# Patient Record
Sex: Female | Born: 1945 | Race: White | Hispanic: No
Health system: Southern US, Community
[De-identification: ages and names within clinical notes are randomized; demographics above are authoritative.]

## PROBLEM LIST (undated history)

## (undated) DIAGNOSIS — E785 Hyperlipidemia, unspecified: Secondary | ICD-10-CM

## (undated) DIAGNOSIS — E079 Disorder of thyroid, unspecified: Secondary | ICD-10-CM

## (undated) DIAGNOSIS — F0280 Dementia in other diseases classified elsewhere without behavioral disturbance: Secondary | ICD-10-CM

## (undated) DIAGNOSIS — F329 Major depressive disorder, single episode, unspecified: Secondary | ICD-10-CM

## (undated) DIAGNOSIS — F419 Anxiety disorder, unspecified: Secondary | ICD-10-CM

## (undated) DIAGNOSIS — I10 Essential (primary) hypertension: Secondary | ICD-10-CM

## (undated) DIAGNOSIS — G309 Alzheimer's disease, unspecified: Secondary | ICD-10-CM

## (undated) DIAGNOSIS — F32A Depression, unspecified: Secondary | ICD-10-CM

---

## 2007-08-13 ENCOUNTER — Encounter: Payer: Self-pay | Admitting: Internal Medicine

## 2009-11-21 ENCOUNTER — Encounter (INDEPENDENT_AMBULATORY_CARE_PROVIDER_SITE_OTHER): Payer: Self-pay | Admitting: *Deleted

## 2010-01-09 ENCOUNTER — Ambulatory Visit: Payer: Self-pay | Admitting: Internal Medicine

## 2010-01-09 DIAGNOSIS — I10 Essential (primary) hypertension: Secondary | ICD-10-CM | POA: Insufficient documentation

## 2010-01-09 DIAGNOSIS — E079 Disorder of thyroid, unspecified: Secondary | ICD-10-CM | POA: Insufficient documentation

## 2010-01-09 DIAGNOSIS — K649 Unspecified hemorrhoids: Secondary | ICD-10-CM | POA: Insufficient documentation

## 2010-01-09 DIAGNOSIS — E785 Hyperlipidemia, unspecified: Secondary | ICD-10-CM | POA: Insufficient documentation

## 2010-01-09 DIAGNOSIS — K5900 Constipation, unspecified: Secondary | ICD-10-CM | POA: Insufficient documentation

## 2010-01-09 DIAGNOSIS — F329 Major depressive disorder, single episode, unspecified: Secondary | ICD-10-CM

## 2010-01-09 DIAGNOSIS — G309 Alzheimer's disease, unspecified: Secondary | ICD-10-CM

## 2010-01-09 DIAGNOSIS — M129 Arthropathy, unspecified: Secondary | ICD-10-CM | POA: Insufficient documentation

## 2010-01-09 DIAGNOSIS — F3289 Other specified depressive episodes: Secondary | ICD-10-CM | POA: Insufficient documentation

## 2010-01-09 DIAGNOSIS — K625 Hemorrhage of anus and rectum: Secondary | ICD-10-CM

## 2010-01-09 DIAGNOSIS — F0280 Dementia in other diseases classified elsewhere without behavioral disturbance: Secondary | ICD-10-CM

## 2010-01-09 DIAGNOSIS — E669 Obesity, unspecified: Secondary | ICD-10-CM

## 2010-01-09 DIAGNOSIS — R1084 Generalized abdominal pain: Secondary | ICD-10-CM

## 2010-01-22 ENCOUNTER — Ambulatory Visit: Admit: 2010-01-22 | Payer: Self-pay | Admitting: Internal Medicine

## 2010-02-08 ENCOUNTER — Encounter (INDEPENDENT_AMBULATORY_CARE_PROVIDER_SITE_OTHER): Payer: Self-pay | Admitting: *Deleted

## 2010-02-09 ENCOUNTER — Ambulatory Visit
Admission: RE | Admit: 2010-02-09 | Discharge: 2010-02-09 | Payer: Self-pay | Source: Home / Self Care | Attending: Internal Medicine | Admitting: Internal Medicine

## 2010-02-13 NOTE — Letter (Signed)
Summary: New Patient letter  [facility]  [address] [place], [place] [address]   Phone: [phone]  Fax: [phone]       11/21/2009 MRN: [id number]  [facility] [address] [place], [place]  [address]  Dear Ms. Slavens,  Welcome to the Gastroenterology Division at [facility].    You are scheduled to see Dr.  Marina Goodell on Tuesday, 01/09/10 at 3:30 p.m.  on the 3rd floor at [facility], [address]  We ask that you try to arrive at our office 15 minutes prior to your appointment time to allow for check-in.  We would like you to complete the enclosed self-administered evaluation form prior to your visit and bring it with you on the day of your appointment.  We will review it with you.  Also, please bring a complete list of all your medications or, if you prefer, bring the medication bottles and we will list them.  Please bring your insurance card so that we may make a copy of it.  If your insurance requires a referral to see a specialist, please bring your referral form from your primary care physician.  Co-payments are due at the time of your visit and may be paid by cash, check or credit card.     Your office visit will consist of a consult with your physician (includes a physical exam), any laboratory testing he/she may order, scheduling of any necessary diagnostic testing (e.g. x-ray, ultrasound, CT-scan), and scheduling of a procedure (e.g. Endoscopy, Colonoscopy) if required.  Please allow enough time on your schedule to allow for any/all of these possibilities.    If you cannot keep your appointment, please call [phone] to cancel or reschedule prior to your appointment date.  This allows [place] the opportunity to schedule an appointment for another patient in need of care.  If you do not cancel or reschedule by 5 p.m. the business day prior to your appointment date, you will be charged a $50.00 late cancellation/no-show fee.    Thank you for  choosing  Gastroenterology for your medical needs.  We appreciate the opportunity to care for you.  Please visit [place] at our website  to learn more about our practice.                     Sincerely,                                                             The Gastroenterology Division

## 2010-02-15 NOTE — Letter (Signed)
Summary: [facility] Instructions  [facility]  [address] [place], [place] [address]   Phone: [phone]  Fax: [phone]       Natalie Wong    July 29, 1945    MRN: [id number]        Procedure Day Dorna Bloom:  Farrell Ours  02/23/10     Arrival Time:  9:30AM     Procedure Time:  10:30AM     Location of Procedure:                    _ X_  [facility] (4th Floor)                      PREPARATION FOR COLONOSCOPY WITH MOVIPREP   Starting 5 days prior to your procedure 02/18/10 do not eat nuts, seeds, popcorn, corn, beans, peas,  salads, or any raw vegetables.  Do not take any fiber supplements (e.g. Metamucil, Citrucel, and Benefiber).  THE DAY BEFORE YOUR PROCEDURE         DATE: 02/22/10  DAY: THURSDAY  1.  Drink clear liquids the entire day-NO SOLID FOOD  2.  Do not drink anything colored red or purple.  Avoid juices with pulp.  No orange juice.  3.  Drink at least 64 oz. (8 glasses) of fluid/clear liquids during the day to prevent dehydration and help the prep work efficiently.  CLEAR LIQUIDS INCLUDE: Water Jello Ice Popsicles Tea (sugar ok, no milk/cream) Powdered fruit flavored drinks Coffee (sugar ok, no milk/cream) Gatorade Juice: apple, white grape, white cranberry  Lemonade Clear bullion, consomm, broth Carbonated beverages (any kind) Strained chicken noodle soup Hard Candy                             4.  In the morning, mix first dose of MoviPrep solution:    Empty 1 Pouch A and 1 Pouch B into the disposable container    Add lukewarm drinking water to the top line of the container. Mix to dissolve    Refrigerate (mixed solution should be used within 24 hrs)  5.  Begin drinking the prep at 5:00 p.m. The MoviPrep container is divided by 4 marks.   Every 15 minutes drink the solution down to the next mark (approximately 8 oz) until the full liter is complete.   6.  Follow completed prep with 16 oz of clear liquid of your choice (Nothing  red or purple).  Continue to drink clear liquids until bedtime.  7.  Before going to bed, mix second dose of MoviPrep solution:    Empty 1 Pouch A and 1 Pouch B into the disposable container    Add lukewarm drinking water to the top line of the container. Mix to dissolve    Refrigerate  THE DAY OF YOUR PROCEDURE      DATE: 02/1010  DAY: FRIDAY  Beginning at 5:30AM (5 hours before procedure):         1. Every 15 minutes, drink the solution down to the next mark (approx 8 oz) until the full liter is complete.  2. Follow completed prep with 16 oz. of clear liquid of your choice.    3. You may drink clear liquids until 8:30AM (2 HOURS BEFORE PROCEDURE).   MEDICATION INSTRUCTIONS  Unless otherwise instructed, you should take regular prescription medications with a small sip of water   as early as possible the morning of your  procedure.   Additional medication instructions: Hold Losartan/HCTZ the morning of procedure.         OTHER INSTRUCTIONS  You will need a responsible adult at least 66 years of age to accompany you and drive you home.   This person must remain in the waiting room during your procedure.  Wear loose fitting clothing that is easily removed.  Leave jewelry and other valuables at home.  However, you may wish to bring a book to read or  an iPod/MP3 player to listen to music as you wait for your procedure to start.  Remove all body piercing jewelry and leave at home.  Total time from sign-in until discharge is approximately 2-3 hours.  You should go home directly after your procedure and rest.  You can resume normal activities the  day after your procedure.  The day of your procedure you should not:   Drive   Make legal decisions   Operate machinery   Drink alcohol   Return to work  You will receive specific instructions about eating, activities and medications before you leave.    The above instructions have been reviewed and explained to me  by   Wyona Almas RN  February 09, 2010 3:07 PM     I fully understand and can verbalize these instructions _____________________________ Date _________

## 2010-02-15 NOTE — Miscellaneous (Signed)
Summary: [facility] Previsit/prep  Clinical Lists Changes  Medications: Added new medication of MOVIPREP 100 GM  SOLR (PEG-KCL-NACL-NASULF-NA ASC-C) As per prep instructions. - Signed Rx of MOVIPREP 100 GM  SOLR (PEG-KCL-NACL-NASULF-NA ASC-C) As per prep instructions.;  #1 x 0;  Signed;  Entered by: Wyona Almas RN;  Authorized by: Hilarie Fredrickson MD;  Method used: Print then Give to Patient Observations: Added new observation of NKA: T (02/09/2010 14:30)    Prescriptions: MOVIPREP 100 GM  SOLR (PEG-KCL-NACL-NASULF-NA ASC-C) As per prep instructions.  #1 x 0   Entered by:   Wyona Almas RN   Authorized by:   Hilarie Fredrickson MD   Signed by:   Wyona Almas RN on 02/09/2010   Method used:   Print then Give to Patient   RxID:   [id number]

## 2010-02-15 NOTE — Assessment & Plan Note (Signed)
Summary: Abdominal pain, constipation, rectal bleeding (new patient)   History of Present Illness Visit Type: new patient  Primary GI MD: Yancey Flemings MD Primary Provider: Stephanie Acre, MD  Requesting Provider: na Chief Complaint: Generalized abd pain, hemorrhoids, constipation, and some rectal bleeding but not all the time  History of Present Illness:   a 65 year old female with significant Alzheimer's dementia, hypertension, hyperlipidemia, obesity, osteoarthritis, and hypothyroidism. She is accompanied today by her daughter who helps provide history. The patient cannot provide a reliable history. It appears that she has had problems with abdominal pain and constipation over the past 6 months. Pain is generally in the lower abdomen and associated with constipation. Improved post defecation. She has had some intermittent rectal bleeding attributed to hemorrhoids. She relocated from [place] to [place] this past September and is in an assisted living facility because of her dementia. For constipation she is on Colace 100 mg b.i.d. and fiber. No nausea vomiting or weight loss. Currently had a GI doctor in [place]. Not clear what evaluations or when. Daughter will try to obtain those records. The patient is a retired Engineer, civil (consulting).   GI Review of Systems    Reports abdominal pain.     Location of  Abdominal pain: lower abdomen.    Denies acid reflux, belching, bloating, chest pain, dysphagia with liquids, dysphagia with solids, heartburn, loss of appetite, nausea, vomiting, vomiting blood, weight loss, and  weight gain.      Reports change in bowel habits, constipation, hemorrhoids, and  rectal bleeding.     Denies anal fissure, black tarry stools, diarrhea, diverticulosis, fecal incontinence, heme positive stool, irritable bowel syndrome, jaundice, light color stool, liver problems, and  rectal pain.    Current Medications (verified): 1)  Colace 100 Mg Caps (Docusate Sodium) .... Take One Capsule By  Mouth Two Times A Day 2)  Namenda 10 Mg Tabs (Memantine Hcl) .... One Tablet By Mouth Two Times A Day 3)  Pravastatin Sodium 20 Mg Tabs (Pravastatin Sodium) .... One Tablet By Mouth At Bedtime 4)  Pristiq 50 Mg Xr24h-Tab (Desvenlafaxine Succinate) .... One Tablet By Mouth Once Daily At Bedtime 5)  Seroquel 50 Mg Tabs (Quetiapine Fumarate) .... One Tablet By Mouth Once Daily At Bedtime 6)  Acetaminophen 500 Mg Caps (Acetaminophen) .... Take Two Tablets By Mouth Every 8 Hours As Needed For Pain 7)  Alprazolam 0.25 Mg Tabs (Alprazolam) .... One Tablet By Mouth Three Times A Day As Needed 8)  Preparation H 0.25-3-85.5 % Supp (Pe-Shark Liver Oil-Cocoa Buttr) .... As Directed 9)  Ibuprofen 200 Mg Tabs (Ibuprofen) .... Two Tablets By Mouth Every 6 Hours As Needed For Pain 10)  Synthroid 150 Mcg Tabs (Levothyroxine Sodium) .... One Tablet By Mouth Once Daily 11)  Vitamin D 1000 Unit Tabs (Cholecalciferol) .... One Tablet By Mouth Once Daily 12)  Losartan Potassium-Hctz 100-12.5 Mg Tabs (Losartan Potassium-Hctz) .... One Tablet By Mouth Once Daily 13)  Fiberchoice 2 Gm Chew (Inulin) .... Two Tablets By Mouth Once Daily 14)  Donepezil Hcl 10 Mg Tabs (Donepezil Hcl) .... One Tablet By Mouth in The Morning Once Daily 15)  Celexa 40 Mg Tabs (Citalopram Hydrobromide) .... One Tablet By Mouth Once Daily in The Morning 16)  Calcium Carbonate-Vitamin D 600-400 Mg-Unit Tabs (Calcium Carbonate-Vitamin D) .... One Tablet By Mouth Once Daily 17)  Fosamax 70 Mg Tabs (Alendronate Sodium) .... One Tablet By Mouth Once Weekly On Empty Stomach  Allergies (verified): No Known Drug Allergies  Past History:  Past Medical History: ALZHEIMER'S DISEASE (ICD-331.0) HYPERLIPIDEMIA (ICD-272.4) HYPERTENSION (ICD-401.9) OBESITY (ICD-278.00) ARTHRITIS (ICD-716.90) THYROID DISORDER (ICD-246.9) DEPRESSION (ICD-311) HEMORRHOIDS (ICD-455.6)  Past Surgical History: Hysterectomy Shoulder Surgery   Family History: No FH of  Colon Cancer:  Social History: Retired Cabin crew Patient has never smoked.  Alcohol Use - no Daily Caffeine Use: 1 daily Illicit Drug Use - no Smoking Status:  never Drug Use:  no  Review of Systems       The patient complains of arthritis/joint pain, confusion, and depression-new.  The patient denies allergy/sinus, anemia, anxiety-new, back pain, blood in urine, breast changes/lumps, change in vision, cough, coughing up blood, fainting, fatigue, fever, headaches-new, hearing problems, heart murmur, heart rhythm changes, itching, menstrual pain, muscle pains/cramps, night sweats, nosebleeds, pregnancy symptoms, shortness of breath, skin rash, sleeping problems, sore throat, swelling of feet/legs, swollen lymph glands, thirst - excessive , urination - excessive , urination changes/pain, urine leakage, vision changes, and voice change.    Vital Signs:  Patient profile:   65 year old female Height:      65 inches Weight:      224 pounds BMI:     37.41 BSA:     2.08 Pulse rate:   64 / minute Pulse rhythm:   regular BP sitting:   128 / 74  (left arm) Cuff size:   regular  Vitals Entered By: Ok Anis CMA (January 09, 2010 3:51 PM)  Physical Exam  General:  Well developed, obese,well nourished, no acute distress. Head:  Normocephalic and atraumatic. Eyes:  PERRLA, no icterus. Mouth:  No deformity or lesions. Neck:  Supple; no masses or thyromegaly. Lungs:  Clear throughout to auscultation. Heart:  Regular rate and rhythm; no murmurs, rubs,  or bruits. Abdomen:  Soft,obese, nontender and nondistended. No masses, hepatosplenomegaly or hernias noted. Normal bowel sounds. Rectal:  deferred until colonoscopy Msk:  Symmetrical with no gross deformities. Normal posture. Pulses:  Normal pulses noted. Extremities:  No clubbing, cyanosis, edema or deformities noted. Neurologic:  Alert and  oriented to name only, with obvious dementia. No focal deficits Skin:  Intact  without significant lesions or rashes. Psych:  Alert and cooperative. Normal mood and affect.   Impression & Recommendations:  Problem # 1:  ABDOMINAL PAIN -GENERALIZED (ICD-789.07) generalized abdominal pain likely due to constipation.  Plan: #1. Colonoscopy given change in bowel habits and bleeding. Rule out colonic lesion.. The nature of the procedure as well as the risks, benefits, and alternatives were reviewed with the patient and her daughter. All questions answered. Movi prep prescribed. Instructions reviewed with the patient's daughter. #2. Obtain old records #3. Consider CT scan if pain persists and colonoscopy unrevealing  Problem # 2:  RECTAL BLEEDING (ICD-569.3) possibly due to hemorrhoids. Rule out neoplasia  Problem # 3:  CONSTIPATION (ICD-564.00) likely functional due to multiple medications. Rule out structural lesion. If colonoscopy negative, would treat with MiraLax. Titrate to need.  Problem # 4:  ALZHEIMER'S DISEASE (ICD-331.0) significant  Patient Instructions: 1)  You have been scheduled for a nurse visit on 01/17/10 at 2:30pm and the colonoscopy is scheduled for 01/22/10 at 3:00pm. You will get all of the instructions for the Colonoscopy on the date of the nurse visit. Please bring the POA to that appointment.  2)  Copy sent to : Stephanie Acre, MD 3)  The medication list was reviewed and reconciled.  All changed / newly prescribed medications were explained.  A complete medication list was provided to the patient /  caregiver.

## 2010-02-23 ENCOUNTER — Other Ambulatory Visit: Payer: Self-pay | Admitting: Internal Medicine

## 2010-02-23 ENCOUNTER — Other Ambulatory Visit (AMBULATORY_SURGERY_CENTER): Payer: Medicare Other | Admitting: Internal Medicine

## 2010-02-23 ENCOUNTER — Encounter: Payer: Self-pay | Admitting: Internal Medicine

## 2010-02-23 DIAGNOSIS — D126 Benign neoplasm of colon, unspecified: Secondary | ICD-10-CM

## 2010-02-23 DIAGNOSIS — K625 Hemorrhage of anus and rectum: Secondary | ICD-10-CM

## 2010-02-23 DIAGNOSIS — R109 Unspecified abdominal pain: Secondary | ICD-10-CM

## 2010-02-23 DIAGNOSIS — K5730 Diverticulosis of large intestine without perforation or abscess without bleeding: Secondary | ICD-10-CM

## 2010-02-26 ENCOUNTER — Other Ambulatory Visit: Payer: Medicare Other

## 2010-02-27 ENCOUNTER — Encounter: Payer: Self-pay | Admitting: Internal Medicine

## 2010-02-27 ENCOUNTER — Other Ambulatory Visit: Payer: Medicare Other

## 2010-03-01 NOTE — Miscellaneous (Signed)
Summary: Anusol Rx  Clinical Lists Changes  Medications: Added new medication of ANUSOL-HC 25 MG  SUPP (HYDROCORTISONE ACETATE) use for hemorrhoids at night - Signed Rx of ANUSOL-HC 25 MG  SUPP (HYDROCORTISONE ACETATE) use for hemorrhoids at night;  #30 x 11;  Signed;  Entered by: Jennye Boroughs RN;  Authorized by: Hilarie Fredrickson MD;  Method used: Print then Give to Patient    Prescriptions: ANUSOL-HC 25 MG  SUPP (HYDROCORTISONE ACETATE) use for hemorrhoids at night  #30 x 11   Entered by:   Jennye Boroughs RN   Authorized by:   Hilarie Fredrickson MD   Signed by:   Jennye Boroughs RN on 02/23/2010   Method used:   Print then Give to Patient   RxID:   [id number]

## 2010-03-01 NOTE — Procedures (Addendum)
Summary: Colonoscopy   Colonoscopy  Procedure date:  02/23/2010  Findings:      Location:  [facility].   COLONOSCOPY PROCEDURE REPORT  PATIENT:  Jenney, Brester  MR#:  [id number] BIRTHDATE:   02/27/1945, 64 yrs. old   GENDER:   female ENDOSCOPIST:   Wilhemina Bonito. Eda Keys, MD REF. BY: Florentina Jenny, M.D. PROCEDURE DATE:  02/23/2010 PROCEDURE:  Colonoscopy with snare polypectomy x 2 ASA CLASS:   Class II INDICATIONS: rectal bleeding, Abdominal pain  MEDICATIONS:    Fentanyl 50 mcg IV, Versed 7 mg IV  DESCRIPTION OF PROCEDURE:   After the risks benefits and alternatives of the procedure were thoroughly explained, informed consent was obtained.  Digital rectal exam was performed and revealed no abnormalities.   The LB CF-[id number] endoscope was introduced through the anus and advanced to the cecum, which was identified by both the appendix and ileocecal valve, without limitations.Time to cecum = 3:47 min.  The quality of the prep was excellent, using MoviPrep.  The instrument was then slowly withdrawn (time = 9:35 min) as the colon was fully examined. <<PROCEDUREIMAGES>>          <<OLD IMAGES>>  FINDINGS:  Two diminutive polyps were found in the cecum and sigmoid colon. Polyps were snared without cautery. Retrieval was successful.   Mild diverticulosis was found in the sigmoid colon.  Otherwise normal colonoscopy without other polyps, masses, vascular ectasias, or inflammatory changes.   Retroflexed views in the rectum revealed internal hemorrhoids.    The scope was then withdrawn from the patient and the procedure completed.  COMPLICATIONS:   None ENDOSCOPIC IMPRESSION:  1) Two polyps - removed  2) Mild diverticulosis in the sigmoid colon  3) Otherwise normal colonoscopy   4) Internal hemorrhoids  RECOMMENDATIONS:  1) Repeat colonoscopy in 5 years if polyp adenomatous; otherwise 10 years  2) My office will arrange for you to have a Contrast enhanced CT scan of  abdomen and pelvis "ongoing abdominal pain".  _______________________________ Wilhemina Bonito. Eda Keys, MD  CC: The Patient; Florentina Jenny, MD    Appended Document: Colonoscopy Spoke with daughter-in-law to schedule CT scan. She states that the family is moving and it is not a good time for them to schedule the CT. Per Dr. Marina Goodell it is ok for the family to call us back when they are ready and settled to schedule the CT scan. Instructed the family that if pain increased they could always take the patient to the ER. Daughter-in-law verbalized understanding.  Appended Document: Colonoscopy recall 5 yrs     Procedures Next Due Date:    Colonoscopy: 02/2015

## 2010-03-01 NOTE — Miscellaneous (Signed)
Summary: Durable Power of [employer] Power of Attorney   Imported By: Lester Whitehawk 02/22/2010 10:33:04  _____________________________________________________________________  External Attachment:    Type:   Image     Comment:   External Document

## 2010-03-07 NOTE — Letter (Signed)
Summary: Patient Notice- Polyp Results  [facility]  [address] [place], [place] [address]   Phone: [phone]  Fax: [phone]        February 27, 2010 MRN: [id number]    [facility] [address] [place], [place]  [address]    Dear Ms. Vavra,  I am pleased to inform you that the colon polyp(s) removed during your recent colonoscopy was (were) found to be benign (no cancer detected) upon pathologic examination.  I recommend you have a repeat colonoscopy examination in 5 years to look for recurrent polyps, as having colon polyps increases your risk for having recurrent polyps or even colon cancer in the future.  Should you develop new or worsening symptoms of abdominal pain, bowel habit changes or bleeding from the rectum or bowels, please schedule an evaluation with either your primary care physician or with me.  Additional information/recommendations:  __ No further action with gastroenterology is needed at this time. Please      follow-up with your primary care physician for your other healthcare      needs.   Please call us if you are having persistent problems or have questions about your condition that have not been fully answered at this time.  Sincerely,  Hilarie Fredrickson MD  This letter has been electronically signed by your physician.  Appended Document: Patient Notice- Polyp Results Letter mailed  Appended Document: Patient Notice- Polyp Results Letter Mailed

## 2010-07-24 ENCOUNTER — Other Ambulatory Visit: Payer: Self-pay | Admitting: Geriatric Medicine

## 2010-07-24 DIAGNOSIS — Z1231 Encounter for screening mammogram for malignant neoplasm of breast: Secondary | ICD-10-CM

## 2010-08-20 ENCOUNTER — Ambulatory Visit
Admission: RE | Admit: 2010-08-20 | Discharge: 2010-08-20 | Disposition: A | Discharge status: Home / Self Care | Payer: Medicare Other | Source: Ambulatory Visit | Attending: Geriatric Medicine | Admitting: Geriatric Medicine

## 2010-08-20 DIAGNOSIS — Z1231 Encounter for screening mammogram for malignant neoplasm of breast: Secondary | ICD-10-CM

## 2011-07-24 ENCOUNTER — Other Ambulatory Visit: Payer: Self-pay | Admitting: Geriatric Medicine

## 2011-07-24 DIAGNOSIS — Z1231 Encounter for screening mammogram for malignant neoplasm of breast: Secondary | ICD-10-CM

## 2011-08-22 ENCOUNTER — Ambulatory Visit
Admission: RE | Admit: 2011-08-22 | Discharge: 2011-08-22 | Disposition: A | Discharge status: Home / Self Care | Payer: Medicare Other | Source: Ambulatory Visit | Attending: Geriatric Medicine | Admitting: Geriatric Medicine

## 2011-08-22 DIAGNOSIS — Z1231 Encounter for screening mammogram for malignant neoplasm of breast: Secondary | ICD-10-CM

## 2012-10-09 ENCOUNTER — Other Ambulatory Visit: Payer: Self-pay

## 2012-10-09 ENCOUNTER — Other Ambulatory Visit: Payer: Self-pay | Admitting: Geriatric Medicine

## 2012-10-09 DIAGNOSIS — Z1231 Encounter for screening mammogram for malignant neoplasm of breast: Secondary | ICD-10-CM

## 2012-11-02 ENCOUNTER — Ambulatory Visit
Admission: RE | Admit: 2012-11-02 | Discharge: 2012-11-02 | Disposition: A | Discharge status: Home / Self Care | Payer: Medicare Other | Source: Ambulatory Visit | Attending: Geriatric Medicine | Admitting: Geriatric Medicine

## 2012-11-02 DIAGNOSIS — Z1231 Encounter for screening mammogram for malignant neoplasm of breast: Secondary | ICD-10-CM

## 2013-08-18 ENCOUNTER — Emergency Department (HOSPITAL_COMMUNITY): Payer: Medicare Other

## 2013-08-18 ENCOUNTER — Emergency Department (HOSPITAL_COMMUNITY)
Admission: EM | Admit: 2013-08-18 | Discharge: 2013-08-18 | Disposition: A | Discharge status: Other Acute Inpatient Hospital | Payer: Medicare Other | Attending: Emergency Medicine | Admitting: Emergency Medicine

## 2013-08-18 DIAGNOSIS — I10 Essential (primary) hypertension: Secondary | ICD-10-CM | POA: Insufficient documentation

## 2013-08-18 DIAGNOSIS — Y9389 Activity, other specified: Secondary | ICD-10-CM | POA: Insufficient documentation

## 2013-08-18 DIAGNOSIS — IMO0002 Reserved for concepts with insufficient information to code with codable children: Secondary | ICD-10-CM | POA: Insufficient documentation

## 2013-08-18 DIAGNOSIS — S0083XA Contusion of other part of head, initial encounter: Principal | ICD-10-CM | POA: Insufficient documentation

## 2013-08-18 DIAGNOSIS — Z79899 Other long term (current) drug therapy: Secondary | ICD-10-CM | POA: Insufficient documentation

## 2013-08-18 DIAGNOSIS — M1990 Unspecified osteoarthritis, unspecified site: Secondary | ICD-10-CM | POA: Diagnosis not present

## 2013-08-18 DIAGNOSIS — F0280 Dementia in other diseases classified elsewhere without behavioral disturbance: Secondary | ICD-10-CM | POA: Diagnosis not present

## 2013-08-18 DIAGNOSIS — Y9289 Other specified places as the place of occurrence of the external cause: Secondary | ICD-10-CM | POA: Diagnosis not present

## 2013-08-18 DIAGNOSIS — S0990XA Unspecified injury of head, initial encounter: Secondary | ICD-10-CM | POA: Diagnosis present

## 2013-08-18 DIAGNOSIS — S1093XA Contusion of unspecified part of neck, initial encounter: Secondary | ICD-10-CM | POA: Diagnosis not present

## 2013-08-18 DIAGNOSIS — W1809XA Striking against other object with subsequent fall, initial encounter: Secondary | ICD-10-CM | POA: Diagnosis not present

## 2013-08-18 DIAGNOSIS — E039 Hypothyroidism, unspecified: Secondary | ICD-10-CM | POA: Insufficient documentation

## 2013-08-18 DIAGNOSIS — G309 Alzheimer's disease, unspecified: Secondary | ICD-10-CM | POA: Diagnosis not present

## 2013-08-18 DIAGNOSIS — E669 Obesity, unspecified: Secondary | ICD-10-CM | POA: Diagnosis not present

## 2013-08-18 DIAGNOSIS — S0003XA Contusion of scalp, initial encounter: Secondary | ICD-10-CM | POA: Diagnosis not present

## 2013-08-18 DIAGNOSIS — S0093XA Contusion of unspecified part of head, initial encounter: Secondary | ICD-10-CM

## 2013-08-18 LAB — URINALYSIS, ROUTINE W REFLEX MICROSCOPIC
Bilirubin Urine: NEGATIVE
GLUCOSE, UA: NEGATIVE mg/dL
Hgb urine dipstick: NEGATIVE
Ketones, ur: NEGATIVE mg/dL
LEUKOCYTES UA: NEGATIVE
Nitrite: NEGATIVE
PROTEIN: 30 mg/dL — AB
SPECIFIC GRAVITY, URINE: 1.027 (ref 1.005–1.030)
Urobilinogen, UA: 0.2 mg/dL (ref 0.0–1.0)
pH: 6.5 (ref 5.0–8.0)

## 2013-08-18 LAB — COMPREHENSIVE METABOLIC PANEL
ALT: 13 U/L (ref 0–35)
ANION GAP: 14 (ref 5–15)
AST: 19 U/L (ref 0–37)
Albumin: 3.7 g/dL (ref 3.5–5.2)
Alkaline Phosphatase: 86 U/L (ref 39–117)
BUN: 15 mg/dL (ref 6–23)
CALCIUM: 9.7 mg/dL (ref 8.4–10.5)
CO2: 24 mEq/L (ref 19–32)
CREATININE: 0.77 mg/dL (ref 0.50–1.10)
Chloride: 102 mEq/L (ref 96–112)
GFR calc non Af Amer: 84 mL/min — ABNORMAL LOW (ref >90)
GLUCOSE: 98 mg/dL (ref 70–99)
POTASSIUM: 3.7 meq/L (ref 3.7–5.3)
Sodium: 140 mEq/L (ref 137–147)
TOTAL PROTEIN: 7.6 g/dL (ref 6.0–8.3)
Total Bilirubin: 0.5 mg/dL (ref 0.3–1.2)

## 2013-08-18 LAB — URINE MICROSCOPIC-ADD ON

## 2013-08-18 LAB — CBC
HCT: 37.7 % (ref 36.0–46.0)
HEMOGLOBIN: 12.7 g/dL (ref 12.0–15.0)
MCH: 31.4 pg (ref 26.0–34.0)
MCHC: 33.7 g/dL (ref 30.0–36.0)
MCV: 93.3 fL (ref 78.0–100.0)
Platelets: 258 10*3/uL (ref 150–400)
RBC: 4.04 MIL/uL (ref 3.87–5.11)
RDW: 13.6 % (ref 11.5–15.5)
WBC: 23 10*3/uL — AB (ref 4.0–10.5)

## 2013-08-18 LAB — TROPONIN I: Troponin I: <0.3 ng/mL (ref <0.30)

## 2013-08-18 LAB — TSH: TSH: 1.94 u[IU]/mL (ref 0.350–4.500)

## 2013-08-18 MED ORDER — ONDANSETRON HCL 4 MG/2ML IJ SOLN
4.0000 mg | Freq: Once | INTRAMUSCULAR | Status: AC
  Administered 2013-08-18: 4 mg via INTRAVENOUS
  Filled 2013-08-18: qty 2

## 2013-08-18 NOTE — ED Notes (Signed)
Report given to Brenna

## 2013-08-18 NOTE — ED Notes (Addendum)
Report given to Seth Bake, Therapist, sports at [facility].  Pt. Will be transferred via [facility].

## 2013-08-18 NOTE — ED Notes (Signed)
Pt. Had dry heaves, Dr. Sherrine Maples is aware of it.

## 2013-08-18 NOTE — ED Notes (Addendum)
Pt. Comes from [facility] and staff reports they heard her yell out and then she fell.  Pt. Has  alzheimers was non-verbal with paramedics.  Pt. Arrived on LSB and C-collar.  Pt. Not tolerating well.  Paramedics opened up the collar.  CBG 135. Vitals stable.  No visible injuries noted.  Pt. Is  Alert with confusion.  Son at the bedside

## 2013-08-18 NOTE — ED Notes (Signed)
Pt unable to void at this time. 

## 2013-08-18 NOTE — ED Provider Notes (Signed)
CSN: [id number]     Arrival date & time 08/18/13  0704 History   First MD Initiated Contact with Patient 08/18/13 [phone]     Chief Complaint  Patient presents with  . Fall   HPI [name] is a 68 yo woman with a PMH of significant Alzheimer's dementia, HTN, HLD, OA, hypothyroidism and obesity who presented via EMS having passed out and fallen at [facility] this morning. Apparently, she was standing up, having followed staff into the laundry room. Staff then observed her "pass out and fall to the ground". They recount that she did have some trauma to her head with the fall. Her son, who is with her this morning, spent time with her over the weekend and stated that she was at her baseline at that time. She has not been ill recently and had no changes to her baseline dementia over the weekend or reportedly, this morning.   On acquisition of further information from the ALF, apparently the patient was actually standing this morning, then was startled by a person walking up to her side and threw up her arms, shrieked, and then fell backwards onto her head.   No past medical history on file. No past surgical history on file. No family history on file. History  Substance Use Topics  . Smoking status: Not on file  . Smokeless tobacco: Not on file  . Alcohol Use: Not on file   OB History   No data available     Review of Systems General: difficult to assess due to baseline dementia, is mobile with some hesitancy with steps/curbs/changes in ground color (has never used cane/walker), but no recent illness, no reported recent fatigue Skin: no rashes HEENT: no reported headaches Cardiac: no reported chest pain, no palpitations Respiratory: no shortness of breath, no wheezing, no cough GI: history of constipation Urinary: no reported dysuria, hematuria, frequency Extremities: baseline edema Msk: no reported joint or muscle pain, no weakness Endocrine: baseline  hypothyroidism, no changes in weight Neurologic: baseline advanced dementia Psychiatric: baseline anxiety   Allergies  Review of patient's allergies indicates no known allergies.  Home Medications   Prior to Admission medications   Medication Sig Start Date End Date Taking? Authorizing Provider  acetaminophen (TYLENOL) 500 MG tablet Take 500 mg by mouth every 6 (six) hours as needed for mild pain.   Yes Historical Provider, MD  alendronate (FOSAMAX) 35 MG tablet Take 35 mg by mouth every 7 (seven) days. Take with a full glass of water on an empty stomach. On Wednesday   Yes Historical Provider, MD  carvedilol (COREG) 3.125 MG tablet Take 3.125 mg by mouth 2 (two) times daily with a meal.   Yes Historical Provider, MD  cholecalciferol (VITAMIN D) 1000 UNITS tablet Take 1,000 Units by mouth daily.   Yes Historical Provider, MD  citalopram (CELEXA) 40 MG tablet Take 40 mg by mouth daily.   Yes Historical Provider, MD  docusate sodium (COLACE) 100 MG capsule Take 100 mg by mouth 2 (two) times daily.   Yes Historical Provider, MD  donepezil (ARICEPT) 10 MG tablet Take 10 mg by mouth at bedtime.   Yes Historical Provider, MD  furosemide (LASIX) 20 MG tablet Take 20 mg by mouth daily.   Yes Historical Provider, MD  hydrALAZINE (APRESOLINE) 10 MG tablet Take 10 mg by mouth 2 (two) times daily.   Yes Historical Provider, MD  hydrocortisone (ANUSOL-HC) 25 MG suppository Place 25 mg rectally at bedtime.  Yes Historical Provider, MD  ibuprofen (ADVIL,MOTRIN) 200 MG tablet Take 200 mg by mouth every 6 (six) hours as needed for mild pain.   Yes Historical Provider, MD  levothyroxine (SYNTHROID, LEVOTHROID) 175 MCG tablet Take 175 mcg by mouth daily before breakfast.   Yes Historical Provider, MD  loperamide (IMODIUM) 2 MG capsule Take 2 mg by mouth as needed for diarrhea or loose stools.   Yes Historical Provider, MD  LORazepam (ATIVAN) 0.5 MG tablet Take 0.5 mg by mouth every 6 (six) hours as needed  for anxiety.   Yes Historical Provider, MD  losartan (COZAAR) 100 MG tablet Take 100 mg by mouth daily.   Yes Historical Provider, MD  nystatin (MYCOSTATIN) powder Apply topically every 6 (six) hours as needed (yeast).   Yes Historical Provider, MD  omeprazole (PRILOSEC) 20 MG capsule Take 20 mg by mouth daily.   Yes Historical Provider, MD  polyethylene glycol (MIRALAX / GLYCOLAX) packet Take 17 g by mouth daily as needed for mild constipation.   Yes Historical Provider, MD  pravastatin (PRAVACHOL) 40 MG tablet Take 40 mg by mouth daily.   Yes Historical Provider, MD  QUEtiapine (SEROQUEL) 50 MG tablet Take 50 mg by mouth at bedtime.   Yes Historical Provider, MD  shark liver oil-cocoa butter (QC HEMORRHOIDAL) 0.25-3-85.5 % suppository Place 1 suppository rectally as needed for hemorrhoids.   Yes Historical Provider, MD  Skin Protectants, Misc. (BAZA PROTECT EX) Apply topically 2 (two) times daily.   Yes Historical Provider, MD  sodium fluoride (PREVIDENT 5000 PLUS) 1.1 % CREA dental cream Place 1 application onto teeth every morning.   Yes Historical Provider, MD   BP 108/75  Pulse 74  Resp 20  SpO2 100% Physical Exam Appearance: uncommunicative woman, wimpering, appears very uncomfortable on and then off of headboard, appears anxious HEENT: small amount of blood caked into hair in the central parietal area, no pain to palpation, PEERL, EOMi Heart: RRR, normal S1S2, no MRG Lungs: CTAB, no WRR Abdomen: obese abdomen, BS+, pain to palpation throughout  Extremities: tender to palpation RLE Neurologic: communication limited to yes and no, consistent with advanced dementia, negative straight leg raise, spontaneously moving head and legs, unable to answer questions, but moans to pain, unable to assess for sensation or coordination, pain to palpation along entire spine Skin: no rashes or lesions Psychiatric: appears very anxious   ED Course  Procedures (including critical care time) Labs  Review Labs Reviewed  COMPREHENSIVE METABOLIC PANEL - Abnormal; Notable for the following:    GFR calc non Af Amer 84 (*)    All other components within normal limits  CBC - Abnormal; Notable for the following:    WBC 23.0 (*)    All other components within normal limits  TROPONIN I  URINALYSIS, ROUTINE W REFLEX MICROSCOPIC  TSH    Imaging Review Dg Chest 1 View  08/18/2013   CLINICAL DATA:  Fall.  EXAM: CHEST - 1 VIEW  COMPARISON:  None.  FINDINGS: Mediastinum and hilar structures normal. Lungs are clear. Cardiomegaly with normal pulmonary vascularity. No focal infiltrate. No pleural effusion or pneumothorax. No acute bony abnormality. Degenerative changes both shoulders and thoracic spine.  IMPRESSION: 1. Minimal cardiomegaly.  No pulmonary venous congestion. 2. No acute pulmonary abnormality. No pneumothorax. Chest wall intact.   Electronically Signed   By: Marcello Moores  Register   On: 08/18/2013 08:35   Dg Lumbar Spine Complete  08/18/2013   CLINICAL DATA:  Pain post trauma  EXAM: LUMBAR  SPINE - COMPLETE 4+ VIEW  COMPARISON:  None.  FINDINGS: Frontal, lateral, spot lumbosacral lateral, and bilateral oblique views were obtained. There are 5 non-rib-bearing lumbar type vertebral bodies. There is mild upper lumbar dextroscoliosis. There is no fracture. There is minimal anterolisthesis of L4 on L5, plate due to spondylosis. There is no other spondylolisthesis. There is fairly marked disc space narrowing at L5-S1. There are prominent anterior osteophytes at L5 and S1. There is slight disc space narrowing at L4-5. There is facet osteoarthritic change at L4-5 and L5-S1 bilaterally.  IMPRESSION: Osteoarthritic change in the lower lumbar region. Slight spondylolisthesis at L4-5 is felt to be due to spondylosis. No fracture appreciable. There is scoliosis.   Electronically Signed   By: Lowella Grip M.D.   On: 08/18/2013 08:36   Dg Pelvis 1-2 Views  08/18/2013   CLINICAL DATA:  Fall.  EXAM: PELVIS - 1-2  VIEW  COMPARISON:  None.  FINDINGS: Mild exchange lumbar spine, both SI joints, both hips. No acute bony abnormality. Calcifications in pelvis consistent with phleboliths.  IMPRESSION: No acute abnormality.   Electronically Signed   By: [place]   On: 08/18/2013 08:32   Ct Head Wo Contrast  08/18/2013   CLINICAL DATA:  Pain post trauma ; Alzheimer's disease  EXAM: CT HEAD WITHOUT CONTRAST  CT CERVICAL SPINE WITHOUT CONTRAST  TECHNIQUE: Multidetector CT imaging of the head and cervical spine was performed following the standard protocol without intravenous contrast. Multiplanar CT image reconstructions of the cervical spine were also generated.  COMPARISON:  None.  FINDINGS: CT HEAD FINDINGS  There is moderate diffuse atrophy with atrophy greatest in both temporal and parietal lobes. There is no intracranial mass, hemorrhage, extra-axial fluid collection, or midline shift. There is mild small vessel disease in the centra semiovale bilaterally. No acute infarct is apparent.  There is a right parietal scalp hematoma. Bony calvarium appears intact. The mastoid air cells are clear. There is mild mucosal thickening in several ethmoid air cells bilaterally.  CT CERVICAL SPINE FINDINGS  There is no fracture or spondylolisthesis. Prevertebral soft tissues and predental space regions are normal. There is moderate disc space narrowing at C5-6 and C6-7. There is facet osteoarthritic change at C5-6 and C6-7 with moderate exit foraminal narrowing bilaterally. There is facet hypertrophy at multiple levels bilaterally. No disc extrusion or stenosis.  IMPRESSION: CT head: Atrophy, most notably in the temporal and parietal lobes bilaterally, consistent with stated diagnosis of Alzheimer's disease. Mild periventricular small vessel disease. No intracranial hemorrhage, mass, or extra-axial fluid. There is a right parietal scalp hematoma. No fracture. Mild ethmoid sinus disease bilaterally.  CT cervical spine: Osteoarthritic  changes several levels. No fracture or spondylolisthesis.   Electronically Signed   By: Lowella Grip M.D.   On: 08/18/2013 08:16   Ct Cervical Spine Wo Contrast  08/18/2013   CLINICAL DATA:  Pain post trauma ; Alzheimer's disease  EXAM: CT HEAD WITHOUT CONTRAST  CT CERVICAL SPINE WITHOUT CONTRAST  TECHNIQUE: Multidetector CT imaging of the head and cervical spine was performed following the standard protocol without intravenous contrast. Multiplanar CT image reconstructions of the cervical spine were also generated.  COMPARISON:  None.  FINDINGS: CT HEAD FINDINGS  There is moderate diffuse atrophy with atrophy greatest in both temporal and parietal lobes. There is no intracranial mass, hemorrhage, extra-axial fluid collection, or midline shift. There is mild small vessel disease in the centra semiovale bilaterally. No acute infarct is apparent.  There is a right parietal  scalp hematoma. Bony calvarium appears intact. The mastoid air cells are clear. There is mild mucosal thickening in several ethmoid air cells bilaterally.  CT CERVICAL SPINE FINDINGS  There is no fracture or spondylolisthesis. Prevertebral soft tissues and predental space regions are normal. There is moderate disc space narrowing at C5-6 and C6-7. There is facet osteoarthritic change at C5-6 and C6-7 with moderate exit foraminal narrowing bilaterally. There is facet hypertrophy at multiple levels bilaterally. No disc extrusion or stenosis.  IMPRESSION: CT head: Atrophy, most notably in the temporal and parietal lobes bilaterally, consistent with stated diagnosis of Alzheimer's disease. Mild periventricular small vessel disease. No intracranial hemorrhage, mass, or extra-axial fluid. There is a right parietal scalp hematoma. No fracture. Mild ethmoid sinus disease bilaterally.  CT cervical spine: Osteoarthritic changes several levels. No fracture or spondylolisthesis.   Electronically Signed   By: Lowella Grip M.D.   On: 08/18/2013 08:16      EKG Interpretation   Date/Time:  Wednesday August 18 2013 07:42:04 EDT Ventricular Rate:  67 PR Interval:  159 QRS Duration: 98 QT Interval:  418 QTC Calculation: 441 R Axis:   51 Text Interpretation:  Sinus rhythm Low voltage, precordial leads Baseline  wander in lead(s) II III aVR aVF No old tracing to compare Confirmed by  [facility]  MD, TREY ([id number]) on 08/18/2013 7:48:36 AM      MDM   Final diagnoses:  None    This 68 yo woman with significant Alzheimer's dementia presented with what was initially reported to be an observed loss of consciousness and fall. She had already been standing at the time of the incident. Investigation for cardiac cause with troponin and EKG revealed baseline wandering but no Q or ST changes. Investigation for other causes of the fall included a CXR, head CT, UA, CBC and BMET which were unremarkable save for a parietal scalp hematoma and an elevated WBC count at 23. Neurologic investigation continued with a c-spine CT which revealed no fractures and no spondylolithesis. Investigation of any injuries from the fall continued with a plain film of the pelvis and lumbar spine and revealed OA and spondylosis but no acute disease. The revised history that a surprise had caused the fall, coupled with the fact that there was no observed loss of consciousness, was very reassuring. The patient's negative UA, along with her negative CXR, do not explain her leukocytosis, which may have been a reaction to her trauma this morning.    Drucilla Schmidt, MD 08/18/13 [phone]

## 2013-08-18 NOTE — ED Notes (Signed)
Pt. Resting comfortably.  Warm blanket given.  Informed Dr. Sherrine Maples that pt. Unable to void.  Pt. Drinking fluids at this time.

## 2013-08-18 NOTE — ED Notes (Signed)
PATIENT MOVED TO [facility] TO AWAIT TRANSPORT BY PTR TO NURSING HOME

## 2013-08-18 NOTE — ED Notes (Signed)
Pt.s daughter updated with plan or care.  [facility] will be taking pt. Back to [facility]

## 2013-08-18 NOTE — ED Notes (Signed)
PTR HAS ARRIVED TO TRANSPORT 

## 2013-08-18 NOTE — Discharge Instructions (Signed)
You had a hematoma in the parietal scalp hematoma after you fell on your head. You also had an elevated white blood cell count. The hematoma will resolve slowly. The white count may be due to the trauma of the fall. Either way, please return to the hospital if you experience new cough, fever, increased confusion, worsening headache, vomiting.  Hematoma A hematoma is a collection of blood under the skin, in an organ, in a body space, in a joint space, or in other tissue. The blood can clot to form a lump that you can see and feel. The lump is often firm and may sometimes become sore and tender. Most hematomas get better in a few days to weeks. However, some hematomas may be serious and require medical care. Hematomas can range in size from very small to very large. CAUSES  A hematoma can be caused by a blunt or penetrating injury. It can also be caused by spontaneous leakage from a blood vessel under the skin. Spontaneous leakage from a blood vessel is more likely to occur in older people, especially those taking blood thinners. Sometimes, a hematoma can develop after certain medical procedures. SIGNS AND SYMPTOMS   A firm lump on the body.  Possible pain and tenderness in the area.  Bruising.Blue, dark blue, purple-red, or yellowish skin may appear at the site of the hematoma if the hematoma is close to the surface of the skin. For hematomas in deeper tissues or body spaces, the signs and symptoms may be subtle. For example, an intra-abdominal hematoma may cause abdominal pain, weakness, fainting, and shortness of breath. An intracranial hematoma may cause a headache or symptoms such as weakness, trouble speaking, or a change in consciousness. DIAGNOSIS  A hematoma can usually be diagnosed based on your medical history and a physical exam. Imaging tests may be needed if your health care provider suspects a hematoma in deeper tissues or body spaces, such as the abdomen, head, or chest. These tests may  include ultrasonography or a CT scan.  TREATMENT  Hematomas usually go away on their own over time. Rarely does the blood need to be drained out of the body. Large hematomas or those that may affect vital organs will sometimes need surgical drainage or monitoring. HOME CARE INSTRUCTIONS   Apply ice to the injured area:   Put ice in a plastic bag.   Place a towel between your skin and the bag.   Leave the ice on for 20 minutes, 2-3 times a day for the first 1 to 2 days.   After the first 2 days, switch to using warm compresses on the hematoma.   Elevate the injured area to help decrease pain and swelling. Wrapping the area with an elastic bandage may also be helpful. Compression helps to reduce swelling and promotes shrinking of the hematoma. Make sure the bandage is not wrapped too tight.   If your hematoma is on a lower extremity and is painful, crutches may be helpful for a couple days.   Only take over-the-counter or prescription medicines as directed by your health care provider. SEEK IMMEDIATE MEDICAL CARE IF:   You have increasing pain, or your pain is not controlled with medicine.   You have a fever.   You have worsening swelling or discoloration.   Your skin over the hematoma breaks or starts bleeding.   Your hematoma is in your chest or abdomen and you have weakness, shortness of breath, or a change in consciousness.  Your hematoma  is on your scalp (caused by a fall or injury) and you have a worsening headache or a change in alertness or consciousness. MAKE SURE YOU:   Understand these instructions.  Will watch your condition.  Will get help right away if you are not doing well or get worse. Document Released: 08/15/2003 Document Revised: 09/02/2012 Document Reviewed: 06/10/2012 [facility] Patient Information 2015 [place], [place]. This information is not intended to replace advice given to you by your health care provider. Make sure you discuss any questions  you have with your health care provider.

## 2013-08-18 NOTE — ED Provider Notes (Signed)
I saw and evaluated the patient, reviewed the resident's note and I agree with the findings and plan.   EKG Interpretation   Date/Time:  Wednesday August 18 2013 07:42:04 EDT Ventricular Rate:  67 PR Interval:  159 QRS Duration: 98 QT Interval:  418 QTC Calculation: 441 R Axis:   51 Text Interpretation:  Sinus rhythm Low voltage, precordial leads Baseline  wander in lead(s) II III aVR aVF No old tracing to compare Confirmed by  [facility]  MD, TREY ([id number]) on 08/18/2013 7:48:36 AM        Houston Siren III, MD 08/18/13 [phone]

## 2013-08-18 NOTE — ED Notes (Signed)
Natalie Wong Young son, [phone]

## 2013-08-18 NOTE — ED Provider Notes (Signed)
I saw and evaluated the patient, reviewed the resident's note and I agree with the findings and plan.   EKG Interpretation   Date/Time:  Wednesday August 18 2013 07:42:04 EDT Ventricular Rate:  67 PR Interval:  159 QRS Duration: 98 QT Interval:  418 QTC Calculation: 441 R Axis:   51 Text Interpretation:  Sinus rhythm Low voltage, precordial leads Baseline  wander in lead(s) II III aVR aVF No old tracing to compare Confirmed by  [facility]  MD, TREY ([id number]) on 08/18/2013 7:48:36 AM      68 yo female presenting after syncope and subsequent fall.  Reported to have hit her head.  She has dementia and is poorly communicative at baseline.  On exam, appears anxious/scared, is alert, does not follow commands, head atraumatic, lungs CTAB, heart sounds normal w RRR, abdomen soft and nontender, right hip TTP without decreased or painful ROM, L spine TTP.  She did not tolerate cervical hard collar and she had no cervical spine TTP, so forcing her to wear collar was felt to be more harmful than helpful.  Plan syncope workup and trauma eval.    Imaging negative.  Labwork only notable for leukocytosis, likely reactive.  Additional history from facility obtained by son and apparent she fell when she was startled.  She did not have syncope.  She remained stable and she ambulated prior to discharge.  Plan dc back to facility.  Clinical Impression: 1. Traumatic hematoma of head, initial encounter   [place] III, MD 08/18/13 [phone]

## 2013-12-31 ENCOUNTER — Encounter (HOSPITAL_COMMUNITY): Payer: Self-pay | Admitting: *Deleted

## 2013-12-31 ENCOUNTER — Emergency Department (HOSPITAL_COMMUNITY)
Admission: EM | Admit: 2013-12-31 | Discharge: 2013-12-31 | Disposition: A | Discharge status: Home / Self Care | Payer: Medicare Other | Attending: Emergency Medicine | Admitting: Emergency Medicine

## 2013-12-31 ENCOUNTER — Emergency Department (HOSPITAL_COMMUNITY): Payer: Medicare Other

## 2013-12-31 DIAGNOSIS — F419 Anxiety disorder, unspecified: Secondary | ICD-10-CM | POA: Insufficient documentation

## 2013-12-31 DIAGNOSIS — F0280 Dementia in other diseases classified elsewhere without behavioral disturbance: Secondary | ICD-10-CM | POA: Insufficient documentation

## 2013-12-31 DIAGNOSIS — G309 Alzheimer's disease, unspecified: Secondary | ICD-10-CM | POA: Diagnosis not present

## 2013-12-31 DIAGNOSIS — E785 Hyperlipidemia, unspecified: Secondary | ICD-10-CM | POA: Diagnosis not present

## 2013-12-31 DIAGNOSIS — E079 Disorder of thyroid, unspecified: Secondary | ICD-10-CM | POA: Insufficient documentation

## 2013-12-31 DIAGNOSIS — R569 Unspecified convulsions: Secondary | ICD-10-CM | POA: Diagnosis present

## 2013-12-31 DIAGNOSIS — Z79899 Other long term (current) drug therapy: Secondary | ICD-10-CM | POA: Diagnosis not present

## 2013-12-31 DIAGNOSIS — F329 Major depressive disorder, single episode, unspecified: Secondary | ICD-10-CM | POA: Insufficient documentation

## 2013-12-31 HISTORY — DX: Dementia in other diseases classified elsewhere, unspecified severity, without behavioral disturbance, psychotic disturbance, mood disturbance, and anxiety: F02.80

## 2013-12-31 HISTORY — DX: Disorder of thyroid, unspecified: E07.9

## 2013-12-31 HISTORY — DX: Essential (primary) hypertension: I10

## 2013-12-31 HISTORY — DX: Alzheimer's disease, unspecified: G30.9

## 2013-12-31 HISTORY — DX: Major depressive disorder, single episode, unspecified: F32.9

## 2013-12-31 HISTORY — DX: Anxiety disorder, unspecified: F41.9

## 2013-12-31 HISTORY — DX: Hyperlipidemia, unspecified: E78.5

## 2013-12-31 HISTORY — DX: Depression, unspecified: F32.A

## 2013-12-31 LAB — COMPREHENSIVE METABOLIC PANEL
ALBUMIN: 3.5 g/dL (ref 3.5–5.2)
ALT: 11 U/L (ref 0–35)
ANION GAP: 12 (ref 5–15)
AST: 15 U/L (ref 0–37)
Alkaline Phosphatase: 84 U/L (ref 39–117)
BILIRUBIN TOTAL: 0.3 mg/dL (ref 0.3–1.2)
BUN: 18 mg/dL (ref 6–23)
CHLORIDE: 101 meq/L (ref 96–112)
CO2: 29 mEq/L (ref 19–32)
CREATININE: 0.83 mg/dL (ref 0.50–1.10)
Calcium: 9.7 mg/dL (ref 8.4–10.5)
GFR calc Af Amer: 82 mL/min — ABNORMAL LOW (ref >90)
GFR calc non Af Amer: 70 mL/min — ABNORMAL LOW (ref >90)
Glucose, Bld: 120 mg/dL — ABNORMAL HIGH (ref 70–99)
POTASSIUM: 3.2 meq/L — AB (ref 3.7–5.3)
Sodium: 142 mEq/L (ref 137–147)
TOTAL PROTEIN: 7.6 g/dL (ref 6.0–8.3)

## 2013-12-31 LAB — PROTIME-INR
INR: 1.05 (ref 0.00–1.49)
Prothrombin Time: 13.8 seconds (ref 11.6–15.2)

## 2013-12-31 LAB — CBC WITH DIFFERENTIAL/PLATELET
BASOS ABS: 0 10*3/uL (ref 0.0–0.1)
Basophils Relative: 0 % (ref 0–1)
Eosinophils Absolute: 0.1 10*3/uL (ref 0.0–0.7)
Eosinophils Relative: 1 % (ref 0–5)
HCT: 35.7 % — ABNORMAL LOW (ref 36.0–46.0)
Hemoglobin: 11.9 g/dL — ABNORMAL LOW (ref 12.0–15.0)
Lymphocytes Relative: 17 % (ref 12–46)
Lymphs Abs: 2 10*3/uL (ref 0.7–4.0)
MCH: 31.4 pg (ref 26.0–34.0)
MCHC: 33.3 g/dL (ref 30.0–36.0)
MCV: 94.2 fL (ref 78.0–100.0)
Monocytes Absolute: 0.6 10*3/uL (ref 0.1–1.0)
Monocytes Relative: 5 % (ref 3–12)
NEUTROS ABS: 9.1 10*3/uL — AB (ref 1.7–7.7)
NEUTROS PCT: 77 % (ref 43–77)
Platelets: 242 10*3/uL (ref 150–400)
RBC: 3.79 MIL/uL — ABNORMAL LOW (ref 3.87–5.11)
RDW: 14 % (ref 11.5–15.5)
WBC: 11.7 10*3/uL — ABNORMAL HIGH (ref 4.0–10.5)

## 2013-12-31 LAB — URINALYSIS, ROUTINE W REFLEX MICROSCOPIC
Bilirubin Urine: NEGATIVE
Glucose, UA: NEGATIVE mg/dL
Hgb urine dipstick: NEGATIVE
KETONES UR: NEGATIVE mg/dL
LEUKOCYTES UA: NEGATIVE
NITRITE: NEGATIVE
PROTEIN: NEGATIVE mg/dL
Specific Gravity, Urine: 1.027 (ref 1.005–1.030)
UROBILINOGEN UA: 0.2 mg/dL (ref 0.0–1.0)
pH: 5.5 (ref 5.0–8.0)

## 2013-12-31 LAB — TROPONIN I

## 2013-12-31 LAB — APTT: APTT: 26 s (ref 24–37)

## 2013-12-31 MED ORDER — LEVETIRACETAM IN NACL 1000 MG/100ML IV SOLN
1000.0000 mg | INTRAVENOUS | Status: AC
  Administered 2013-12-31: 1000 mg via INTRAVENOUS
  Filled 2013-12-31: qty 100

## 2013-12-31 MED ORDER — LEVETIRACETAM 500 MG PO TABS: 500.0000 mg | ORAL_TABLET | Freq: Two times a day (BID) | ORAL | Status: AC

## 2013-12-31 MED ORDER — POTASSIUM CHLORIDE CRYS ER 20 MEQ PO TBCR
20.0000 meq | EXTENDED_RELEASE_TABLET | Freq: Once | ORAL | Status: AC
  Administered 2013-12-31: 20 meq via ORAL
  Filled 2013-12-31: qty 1

## 2013-12-31 MED ORDER — LEVETIRACETAM 500 MG PO TABS: 500.0000 mg | ORAL_TABLET | Freq: Two times a day (BID) | ORAL | Status: DC

## 2013-12-31 NOTE — ED Notes (Signed)
TJ, son  [phone] Call with updates

## 2013-12-31 NOTE — ED Notes (Signed)
Pt. Is from the [place]. Pt. Was being assisted to the bathroom. Pt. [place] forward and staff assisted to the ground. Once on the ground pt. Had a seizure that lasted 3 or 4 min.

## 2013-12-31 NOTE — ED Provider Notes (Signed)
CSN: [id number]     Arrival date & time 12/31/13  0531 History   First MD Initiated Contact with Patient 12/31/13 0600     Chief Complaint  Patient presents with  . Seizures   (Consider location/radiation/quality/duration/timing/severity/associated sxs/prior Treatment) HPI Natalie Wong is a 68 yo female presenting with report of full body seizure activity this am lasting 3-4 min.  She was being assisted to the bathroom and stumbled forward and was assisted to the floor when the seizure activity started.  She was post-ictal afterward until arriving in the ED.  She has advanced Alzheimer's dementia and has limited verbal responses at baseline.  She has been alert in the ED.  Staff reports 1 seizure in the past after falling and hitting her head but not currently on any meds.  No report of injury before or after seizure.    Past Medical History  Diagnosis Date  . Alzheimer's dementia   . Depression   . Anxiety   . Hypertension   . Thyroid disease   . Hyperlipidemia    History reviewed. No pertinent past surgical history. History reviewed. No pertinent family history. History  Substance Use Topics  . Smoking status: Unknown If Ever Smoked  . Smokeless tobacco: Not on file  . Alcohol Use: No   OB History    No data available     Review of Systems  Unable to perform ROS: Dementia      Allergies  Review of patient's allergies indicates no known allergies.  Home Medications   Prior to Admission medications   Medication Sig Start Date End Date Taking? Authorizing Provider  acetaminophen (TYLENOL) 500 MG tablet Take 500 mg by mouth every 6 (six) hours as needed for mild pain.   Yes Historical Provider, MD  alendronate (FOSAMAX) 35 MG tablet Take 35 mg by mouth every 7 (seven) days. Take with a full glass of water on an empty stomach. On Wednesday   Yes Historical Provider, MD  carvedilol (COREG) 3.125 MG tablet Take 3.125 mg by mouth 2 (two) times daily with a meal.   Yes  Historical Provider, MD  cholecalciferol (VITAMIN D) 1000 UNITS tablet Take 1,000 Units by mouth daily.   Yes Historical Provider, MD  diltiazem 2 % GEL Apply 1 application topically 3 (three) times daily.   Yes Historical Provider, MD  docusate sodium (COLACE) 100 MG capsule Take 100 mg by mouth 2 (two) times daily.   Yes Historical Provider, MD  donepezil (ARICEPT) 10 MG tablet Take 10 mg by mouth 2 (two) times daily.    Yes Historical Provider, MD  furosemide (LASIX) 20 MG tablet Take 20 mg by mouth daily.   Yes Historical Provider, MD  hydrALAZINE (APRESOLINE) 10 MG tablet Take 10 mg by mouth 2 (two) times daily.   Yes Historical Provider, MD  hydrochlorothiazide (HYDRODIURIL) 25 MG tablet Take 25 mg by mouth daily.   Yes Historical Provider, MD  hydrocortisone (ANUSOL-HC) 25 MG suppository Place 25 mg rectally at bedtime.   Yes Historical Provider, MD  hydrocortisone cream 1 % Apply 1 application topically 2 (two) times daily.   Yes Historical Provider, MD  ibuprofen (ADVIL,MOTRIN) 200 MG tablet Take 200 mg by mouth every 6 (six) hours as needed for mild pain.   Yes Historical Provider, MD  levothyroxine (SYNTHROID, LEVOTHROID) 175 MCG tablet Take 175 mcg by mouth daily before breakfast.   Yes Historical Provider, MD  lidocaine (XYLOCAINE) 2 % jelly Apply 1 application topically 3 (three) times  daily.   Yes Historical Provider, MD  loperamide (IMODIUM) 2 MG capsule Take 2 mg by mouth as needed for diarrhea or loose stools.   Yes Historical Provider, MD  LORazepam (ATIVAN) 0.5 MG tablet Take 0.5 mg by mouth 2 (two) times daily.    Yes Historical Provider, MD  losartan (COZAAR) 100 MG tablet Take 100 mg by mouth daily.   Yes Historical Provider, MD  mupirocin cream (BACTROBAN) 2 % Apply 1 application topically 2 (two) times daily.   Yes Historical Provider, MD  nystatin (MYCOSTATIN) powder Apply topically every 6 (six) hours as needed (yeast).   Yes Historical Provider, MD  omeprazole (PRILOSEC)  20 MG capsule Take 20 mg by mouth daily.   Yes Historical Provider, MD  polyethylene glycol (MIRALAX / GLYCOLAX) packet Take 17 g by mouth daily as needed for mild constipation.   Yes Historical Provider, MD  potassium chloride (K-DUR) 10 MEQ tablet Take 10 mEq by mouth daily.   Yes Historical Provider, MD  pravastatin (PRAVACHOL) 40 MG tablet Take 40 mg by mouth daily.   Yes Historical Provider, MD  QUEtiapine (SEROQUEL) 50 MG tablet Take 50 mg by mouth at bedtime.   Yes Historical Provider, MD  shark liver oil-cocoa butter (QC HEMORRHOIDAL) 0.25-3-85.5 % suppository Place 1 suppository rectally as needed for hemorrhoids.   Yes Historical Provider, MD  Skin Protectants, Misc. (BAZA PROTECT EX) Apply topically 2 (two) times daily.   Yes Historical Provider, MD  sodium fluoride (PREVIDENT 5000 PLUS) 1.1 % CREA dental cream Place 1 application onto teeth every morning.   Yes Historical Provider, MD  citalopram (CELEXA) 40 MG tablet Take 40 mg by mouth daily.    Historical Provider, MD   BP 139/74 mmHg  Pulse 70  Temp(Src) 98.5 F (36.9 C) (Oral)  Resp 15  Ht 5\' 5"  (1.651 m)  Wt 224 lb (101.606 kg)  BMI 37.28 kg/m2  SpO2 96% Physical Exam  Constitutional: She appears well-developed and well-nourished. No distress.  HENT:  Head: Normocephalic and atraumatic.  Mouth/Throat: Oropharynx is clear and moist. No oropharyngeal exudate.  Eyes: Conjunctivae are normal.  Neck: Neck supple. No thyromegaly present.  Cardiovascular: Normal rate, regular rhythm and intact distal pulses.   Pulmonary/Chest: Effort normal and breath sounds normal. No respiratory distress. She has no wheezes. She has no rales. She exhibits no tenderness.  Abdominal: Soft. There is no tenderness.  Musculoskeletal: She exhibits no tenderness.  Lymphadenopathy:    She has no cervical adenopathy.  Neurological: She is alert. GCS eye subscore is 4. GCS verbal subscore is 3. GCS motor subscore is 5.  Pt has limited verbal  response at baseline and does not consistently follow commands.     Skin: Skin is warm and dry. No rash noted. She is not diaphoretic.  Psychiatric: She has a normal mood and affect.  Nursing note and vitals reviewed.   ED Course  Procedures (including critical care time) Labs Review Labs Reviewed  CBC WITH DIFFERENTIAL - Abnormal; Notable for the following:    WBC 11.7 (*)    RBC 3.79 (*)    Hemoglobin 11.9 (*)    HCT 35.7 (*)    Neutro Abs 9.1 (*)    All other components within normal limits  COMPREHENSIVE METABOLIC PANEL - Abnormal; Notable for the following:    Potassium 3.2 (*)    Glucose, Bld 120 (*)    GFR calc non Af Amer 70 (*)    GFR calc Af Amer 82 (*)  All other components within normal limits  TROPONIN I  PROTIME-INR  URINALYSIS, ROUTINE W REFLEX MICROSCOPIC  APTT    Imaging Review Ct Head Wo Contrast  12/31/2013   CLINICAL DATA:  Seizure.  Fall.  EXAM: CT HEAD WITHOUT CONTRAST  TECHNIQUE: Contiguous axial images were obtained from the base of the skull through the vertex without intravenous contrast.  COMPARISON:  08/18/2013  FINDINGS: Skull and Sinuses:Negative for fracture or destructive process. The mastoids, middle ears, and imaged paranasal sinuses are clear.  Orbits: No acute abnormality.  Brain: No evidence of acute infarction, hemorrhage, hydrocephalus, or mass lesion/mass effect.  There is marked cerebral atrophy with predilection for the temporal and parietal lobes. Volume loss is more extensive on the left, which can be seen with chronic arterial stenosis.  IMPRESSION: 1. No acute intracranial injury or fracture. 2. Severe brain atrophy, as above.   Electronically Signed   By: Jorje Guild M.D.   On: 12/31/2013 07:08     EKG Interpretation None      MDM   Final diagnoses:  Seizure   68 yo with seizure this am while using the bathroom.  Discussed case with Dr. Lita Mains.  CBC, CMP, Troponin, EKG, CT head.  Labs and CT reviewed without  significant findings, except hypokalemia: 3.2, will replete orally.  Dr. Doy Mince (neuro) consulted regarding new onset seizure for recommendations.  Pt received IV loading dose of Keppra and will start bid PO keppra and safe to be discharged back to nsg facility with neuro outpt follow-up.  Pt is well-appearing, in no acute distress and vital signs are stable.  She appears safe to be discharged.  Discharge include follow-up with their PCP.  Return precautions provided with discharge instructions.     Filed Vitals:   12/31/13 0730 12/31/13 0745 12/31/13 0815 12/31/13 1129  BP: 131/63 129/59 143/69 102/71  Pulse: 63 60 73 62  Temp:    98.2 F (36.8 C)  TempSrc:    Oral  Resp: 14 14 19 16   Height:      Weight:      SpO2: 97% 98% 97% 99%    Meds given in ED:  Medications  potassium chloride SA (K-DUR,KLOR-CON) CR tablet 20 mEq (20 mEq Oral Given 12/31/13 0738)  levETIRAcetam (KEPPRA) IVPB 1000 mg/100 mL premix (1,000 mg Intravenous Given 12/31/13 0954)    Discharge Medication List as of 12/31/2013 10:53 AM    START taking these medications   Details  levETIRAcetam (KEPPRA) 500 MG tablet Take 1 tablet (500 mg total) by mouth 2 (two) times daily., Starting 12/31/2013, Until Discontinued, Print          Britt Bottom, NP 01/01/14 2013  Julianne Rice, MD 01/06/14 [phone]

## 2013-12-31 NOTE — ED Notes (Signed)
Son updated and aware that pt returned to facility

## 2013-12-31 NOTE — Discharge Instructions (Signed)
Please follow the directions provided.  Be sure to follow-up with her primary care provider.  The neurologist in the hospital have ordered [facility] for her to reduce the likeihood of her seizures.  She was given a loading dose in the emergency department.  She should start taking Keppra 500 mg twice a day by mouth.  She may need further follow-up by an outpatient neurologist but this can be determined by her primary care provider.  Don't hesitate to return for any new, worsening or concerning symptoms.     SEEK IMMEDIATE MEDICAL CARE IF:  The seizure lasts longer than 5 minutes.  The seizure is severe or you do not wake up immediately after the seizure.  You have an altered mental status after the seizure.  You are having more frequent or worsening seizures. Someone should drive you to the emergency department or call local emergency services (911 in [place]

## 2013-12-31 NOTE — ED Notes (Signed)
Pt ambulated in department with standby assist. Pt given juice and placed in chair at nurses station.

## 2013-12-31 NOTE — Consult Note (Signed)
NEURO HOSPITALIST CONSULT NOTE    Reason for Consult: possible seizure  HPI:                                                                                                                                          Natalie Wong is an 68 y.o. female who lives in a SNF and has significant dementia. Per son at baseline he "Verbal capacity is very minimal and she has been declining significantly.  At time she may be able to follow a command or say one word but majority of time she is very docile.  She has been having more crying spells over the past month and place on Seroquel.  Today while eat nursing facility she was eased to the commode when she was noted to have shaking in both arms and legs.  Son is not sure if she in the act of defecating or urinating.  This lasted ro 2-3 minutes but patient was alert the whole time. She currently is back to baseline.    The son noted back in August of this year she was startled and fell backward hitting her head.  After she struck her head she has a seizure and then as second seizure while hospitalized. She has been stable since that date and has had no further seizures.   Past Medical History  Diagnosis Date  . Alzheimer's dementia   . Depression   . Anxiety   . Hypertension   . Thyroid disease   . Hyperlipidemia     History reviewed. No pertinent past surgical history.  Family History  Problem Relation Age of Onset  . Hypertension Mother   . Hypertension Father      Social History:  reports that she does not drink alcohol. Her tobacco and drug histories are not on file.  No Known Allergies  MEDICATIONS:                                                                                                                     Current Facility-Administered Medications  Medication Dose Route Frequency Provider Last Rate Last Dose  . levETIRAcetam (KEPPRA) IVPB 1000 mg/100 mL premix  1,000 mg Intravenous STAT Marliss Coots, PA-C       . levETIRAcetam (KEPPRA) tablet 500  mg  500 mg Oral BID Marliss Coots, PA-C       Current Outpatient Prescriptions  Medication Sig Dispense Refill  . acetaminophen (TYLENOL) 500 MG tablet Take 500 mg by mouth every 6 (six) hours as needed for mild pain.    Marland Kitchen alendronate (FOSAMAX) 35 MG tablet Take 35 mg by mouth every 7 (seven) days. Take with a full glass of water on an empty stomach. On Wednesday    . carvedilol (COREG) 3.125 MG tablet Take 3.125 mg by mouth 2 (two) times daily with a meal.    . cholecalciferol (VITAMIN D) 1000 UNITS tablet Take 1,000 Units by mouth daily.    Marland Kitchen diltiazem 2 % GEL Apply 1 application topically 3 (three) times daily.    Marland Kitchen docusate sodium (COLACE) 100 MG capsule Take 100 mg by mouth 2 (two) times daily.    Marland Kitchen donepezil (ARICEPT) 10 MG tablet Take 10 mg by mouth 2 (two) times daily.     . furosemide (LASIX) 20 MG tablet Take 20 mg by mouth daily.    . hydrALAZINE (APRESOLINE) 10 MG tablet Take 10 mg by mouth 2 (two) times daily.    . hydrochlorothiazide (HYDRODIURIL) 25 MG tablet Take 25 mg by mouth daily.    . hydrocortisone (ANUSOL-HC) 25 MG suppository Place 25 mg rectally at bedtime.    . hydrocortisone cream 1 % Apply 1 application topically 2 (two) times daily.    Marland Kitchen ibuprofen (ADVIL,MOTRIN) 200 MG tablet Take 200 mg by mouth every 6 (six) hours as needed for mild pain.    Marland Kitchen levothyroxine (SYNTHROID, LEVOTHROID) 175 MCG tablet Take 175 mcg by mouth daily before breakfast.    . lidocaine (XYLOCAINE) 2 % jelly Apply 1 application topically 3 (three) times daily.    Marland Kitchen loperamide (IMODIUM) 2 MG capsule Take 2 mg by mouth as needed for diarrhea or loose stools.    Marland Kitchen LORazepam (ATIVAN) 0.5 MG tablet Take 0.5 mg by mouth 2 (two) times daily.     Marland Kitchen losartan (COZAAR) 100 MG tablet Take 100 mg by mouth daily.    . mupirocin cream (BACTROBAN) 2 % Apply 1 application topically 2 (two) times daily.    Marland Kitchen nystatin (MYCOSTATIN) powder Apply topically every 6 (six) hours  as needed (yeast).    Marland Kitchen omeprazole (PRILOSEC) 20 MG capsule Take 20 mg by mouth daily.    . polyethylene glycol (MIRALAX / GLYCOLAX) packet Take 17 g by mouth daily as needed for mild constipation.    . potassium chloride (K-DUR) 10 MEQ tablet Take 10 mEq by mouth daily.    . pravastatin (PRAVACHOL) 40 MG tablet Take 40 mg by mouth daily.    . QUEtiapine (SEROQUEL) 50 MG tablet Take 50 mg by mouth at bedtime.    . shark liver oil-cocoa butter (QC HEMORRHOIDAL) 0.25-3-85.5 % suppository Place 1 suppository rectally as needed for hemorrhoids.    . Skin Protectants, Misc. (BAZA PROTECT EX) Apply topically 2 (two) times daily.    . sodium fluoride (PREVIDENT 5000 PLUS) 1.1 % CREA dental cream Place 1 application onto teeth every morning.    . citalopram (CELEXA) 40 MG tablet Take 40 mg by mouth daily.       ROS:  History obtained from son at bedside.   General ROS: negative for - chills, fatigue, fever, night sweats, weight gain or weight loss Psychological ROS: negative for - behavioral disorder, hallucinations, memory difficulties, mood swings or suicidal ideation Ophthalmic ROS: negative for - blurry vision, double vision, eye pain or loss of vision ENT ROS: negative for - epistaxis, nasal discharge, oral lesions, sore throat, tinnitus or vertigo Allergy and Immunology ROS: negative for - hives or itchy/watery eyes Hematological and Lymphatic ROS: negative for - bleeding problems, bruising or swollen lymph nodes Endocrine ROS: negative for - galactorrhea, hair pattern changes, polydipsia/polyuria or temperature intolerance Respiratory ROS: negative for - cough, hemoptysis, shortness of breath or wheezing Cardiovascular ROS: negative for - chest pain, dyspnea on exertion, edema or irregular heartbeat Gastrointestinal ROS: negative for - abdominal pain, diarrhea,  hematemesis, nausea/vomiting or stool incontinence Genito-Urinary ROS: negative for - dysuria, hematuria, incontinence or urinary frequency/urgency Musculoskeletal ROS: negative for - joint swelling or muscular weakness Neurological ROS: as noted in HPI Dermatological ROS: negative for rash and skin lesion changes   Blood pressure 143/69, pulse 73, temperature 98.5 F (36.9 C), temperature source Oral, resp. rate 19, height 5\' 5"  (1.651 m), weight 101.606 kg (224 lb), SpO2 97 %.   Neurologic Examination:                                                                                                      HEENT-  Normocephalic, no lesions, without obvious abnormality.  Normal external eye and conjunctiva.  Normal TM's bilaterally.  Normal auditory canals and external ears. Normal external nose, mucus membranes and septum.  Normal pharynx. Cardiovascular- regular rate and rhythm, S1, S2 normal, no murmur, click, rub or gallop, pulses palpable throughout   Lungs- chest clear, no wheezing, rales, normal symmetric air entry Abdomen- soft, non-tender; bowel sounds normal; no masses,  no organomegaly Extremities- less then 2 second capillary refill Lymph-no adenopathy palpable Musculoskeletal-no joint tenderness, deformity or swelling Skin-warm and dry, no hyperpigmentation, vitiligo, or suspicious lesions  Neurological Examination Mental Status: At baseline non verbal other than "a few words".  She  Is able to mimick some movements such as lifting her arms, sticking her tongue out and following my finger.  In no distress Cranial Nerves: II: Discs flat bilaterally; blinks to threat bilaterally, pupils equal, round, reactive to light and accommodation III,IV, VI: ptosis not present, extra-ocular motions intact bilaterally V,VII: smile symmetric, facial light touch sensation normal bilaterally VIII: hearing normal bilaterally IX,X: gag reflex present XI: bilateral shoulder shrug XII: midline  tongue extension Motor: Moving all extremities antigravity.  Sensory: withdrawals to pain in all extremities.  Deep Tendon Reflexes: 2+ and symmetric throughout Plantars: Right: downgoing   Left: downgoing Cerebellar: Unable to assess Gait: not assessed due to safety.       Lab Results: Basic Metabolic Panel:  Recent Labs Lab 12/31/13 0600  NA 142  K 3.2*  CL 101  CO2 29  GLUCOSE 120*  BUN 18  CREATININE 0.83  CALCIUM 9.7    Liver Function Tests:  Recent Labs Lab  12/31/13 0600  AST 15  ALT 11  ALKPHOS 84  BILITOT 0.3  PROT 7.6  ALBUMIN 3.5   No results for input(s): LIPASE, AMYLASE in the last 168 hours. No results for input(s): AMMONIA in the last 168 hours.  CBC:  Recent Labs Lab 12/31/13 0600  WBC 11.7*  NEUTROABS 9.1*  HGB 11.9*  HCT 35.7*  MCV 94.2  PLT 242    Cardiac Enzymes:  Recent Labs Lab 12/31/13 0600  TROPONINI <0.30    Lipid Panel: No results for input(s): CHOL, TRIG, HDL, CHOLHDL, VLDL, LDLCALC in the last 168 hours.  CBG: No results for input(s): GLUCAP in the last 168 hours.  Microbiology: No results found for this or any previous visit.  Coagulation Studies:  Recent Labs  12/31/13 0600  LABPROT 13.8  INR 1.05    Imaging: Ct Head Wo Contrast  12/31/2013   CLINICAL DATA:  Seizure.  Fall.  EXAM: CT HEAD WITHOUT CONTRAST  TECHNIQUE: Contiguous axial images were obtained from the base of the skull through the vertex without intravenous contrast.  COMPARISON:  08/18/2013  FINDINGS: Skull and Sinuses:Negative for fracture or destructive process. The mastoids, middle ears, and imaged paranasal sinuses are clear.  Orbits: No acute abnormality.  Brain: No evidence of acute infarction, hemorrhage, hydrocephalus, or mass lesion/mass effect.  There is marked cerebral atrophy with predilection for the temporal and parietal lobes. Volume loss is more extensive on the left, which can be seen with chronic arterial stenosis.   IMPRESSION: 1. No acute intracranial injury or fracture. 2. Severe brain atrophy, as above.   Electronically Signed   By: Jorje Guild M.D.   On: 12/31/2013 07:08     Etta Quill PA-C [facility] [phone]  12/31/2013, 9:15 AM   Patient seen and examined.  Clinical course and management discussed.  Necessary edits performed.  I agree with the above.  Assessment and plan of care developed and discussed below.     Assessment/Plan: 67 year old demented female presenting with seizure-like event.  Has had some questionable events in the past as well.  Head CT personally reviewed and shows no acute changes.  Atrophy seen.  With events being recurrent and risk factors for seizures will start antiepileptics.  Recommendations: 1.  Keppra 1000mg  IV now 2.  Maintenance Keppra of 500mg  BID with first dose to start tonight 3.  Patient to follow up on an outpatient basis.    Case discussed with Almyra Free, MD [facility] [phone]  12/31/2013  10:06 AM

## 2014-03-13 ENCOUNTER — Encounter (HOSPITAL_COMMUNITY): Payer: Self-pay | Admitting: Emergency Medicine

## 2014-03-13 ENCOUNTER — Emergency Department (HOSPITAL_COMMUNITY)

## 2014-03-13 ENCOUNTER — Emergency Department (HOSPITAL_COMMUNITY)
Admission: EM | Admit: 2014-03-13 | Discharge: 2014-03-13 | Disposition: A | Discharge status: Home / Self Care | Attending: Emergency Medicine | Admitting: Emergency Medicine

## 2014-03-13 DIAGNOSIS — S0003XA Contusion of scalp, initial encounter: Secondary | ICD-10-CM | POA: Insufficient documentation

## 2014-03-13 DIAGNOSIS — Y9289 Other specified places as the place of occurrence of the external cause: Secondary | ICD-10-CM | POA: Insufficient documentation

## 2014-03-13 DIAGNOSIS — Y9389 Activity, other specified: Secondary | ICD-10-CM | POA: Diagnosis not present

## 2014-03-13 DIAGNOSIS — W19XXXA Unspecified fall, initial encounter: Secondary | ICD-10-CM

## 2014-03-13 DIAGNOSIS — Z7952 Long term (current) use of systemic steroids: Secondary | ICD-10-CM | POA: Insufficient documentation

## 2014-03-13 DIAGNOSIS — E785 Hyperlipidemia, unspecified: Secondary | ICD-10-CM | POA: Insufficient documentation

## 2014-03-13 DIAGNOSIS — G309 Alzheimer's disease, unspecified: Secondary | ICD-10-CM | POA: Diagnosis not present

## 2014-03-13 DIAGNOSIS — F0280 Dementia in other diseases classified elsewhere without behavioral disturbance: Secondary | ICD-10-CM | POA: Diagnosis not present

## 2014-03-13 DIAGNOSIS — S0990XA Unspecified injury of head, initial encounter: Secondary | ICD-10-CM | POA: Diagnosis present

## 2014-03-13 DIAGNOSIS — W1839XA Other fall on same level, initial encounter: Secondary | ICD-10-CM | POA: Diagnosis not present

## 2014-03-13 DIAGNOSIS — Y998 Other external cause status: Secondary | ICD-10-CM | POA: Insufficient documentation

## 2014-03-13 DIAGNOSIS — Z79899 Other long term (current) drug therapy: Secondary | ICD-10-CM | POA: Diagnosis not present

## 2014-03-13 DIAGNOSIS — E039 Hypothyroidism, unspecified: Secondary | ICD-10-CM | POA: Insufficient documentation

## 2014-03-13 NOTE — ED Provider Notes (Addendum)
CSN: [id number]     Arrival date & time 03/13/14  7510 History   First MD Initiated Contact with Patient 03/13/14 0820     Chief Complaint  Patient presents with  . Fall     (Consider location/radiation/quality/duration/timing/severity/associated sxs/prior Treatment) HPI Comments: Pt lives on a dementia unit and had a fall today per facility with possible seizure after the fall.  Unclear if pt had incontinence but no tongue biting and per son pt is at her baseline.  He states she has progressive dementia and has a DNR and on hospice.  Asking for head CT to just evaluate if she has a bleed.  Otherwise he states he wants no blood work or urine testing.  He just wants his mom to be comfortable.  No anticoagulation.  Son states over the last few weeks she has been more combative and requiring more sedation.  Patient is a 69 y.o. female presenting with fall. The history is provided by a relative and the nursing home. The history is limited by the condition of the patient.  Fall This is a recurrent problem. The current episode started 1 to 2 hours ago. The problem occurs constantly.    Past Medical History  Diagnosis Date  . Alzheimer's dementia   . Depression   . Anxiety   . Hypertension   . Thyroid disease   . Hyperlipidemia    History reviewed. No pertinent past surgical history. Family History  Problem Relation Age of Onset  . Hypertension Mother   . Hypertension Father    History  Substance Use Topics  . Smoking status: Unknown If Ever Smoked  . Smokeless tobacco: Not on file  . Alcohol Use: No   OB History    No data available     Review of Systems  Unable to perform ROS     Allergies  Review of patient's allergies indicates no known allergies.  Home Medications   Prior to Admission medications   Medication Sig Start Date End Date Taking? Authorizing Provider  acetaminophen (TYLENOL) 500 MG tablet Take 500 mg by mouth every 6 (six) hours as needed for mild pain.     Historical Provider, MD  alendronate (FOSAMAX) 35 MG tablet Take 35 mg by mouth every 7 (seven) days. Take with a full glass of water on an empty stomach. On Wednesday    Historical Provider, MD  carvedilol (COREG) 3.125 MG tablet Take 3.125 mg by mouth 2 (two) times daily with a meal.    Historical Provider, MD  cholecalciferol (VITAMIN D) 1000 UNITS tablet Take 1,000 Units by mouth daily.    Historical Provider, MD  citalopram (CELEXA) 40 MG tablet Take 40 mg by mouth daily.    Historical Provider, MD  diltiazem 2 % GEL Apply 1 application topically 3 (three) times daily.    Historical Provider, MD  docusate sodium (COLACE) 100 MG capsule Take 100 mg by mouth 2 (two) times daily.    Historical Provider, MD  donepezil (ARICEPT) 10 MG tablet Take 10 mg by mouth 2 (two) times daily.     Historical Provider, MD  furosemide (LASIX) 20 MG tablet Take 20 mg by mouth daily.    Historical Provider, MD  hydrALAZINE (APRESOLINE) 10 MG tablet Take 10 mg by mouth 2 (two) times daily.    Historical Provider, MD  hydrochlorothiazide (HYDRODIURIL) 25 MG tablet Take 25 mg by mouth daily.    Historical Provider, MD  hydrocortisone (ANUSOL-HC) 25 MG suppository Place 25 mg rectally  at bedtime.    Historical Provider, MD  hydrocortisone cream 1 % Apply 1 application topically 2 (two) times daily.    Historical Provider, MD  ibuprofen (ADVIL,MOTRIN) 200 MG tablet Take 200 mg by mouth every 6 (six) hours as needed for mild pain.    Historical Provider, MD  levETIRAcetam (KEPPRA) 500 MG tablet Take 1 tablet (500 mg total) by mouth 2 (two) times daily. 12/31/13   Britt Bottom, NP  levothyroxine (SYNTHROID, LEVOTHROID) 175 MCG tablet Take 175 mcg by mouth daily before breakfast.    Historical Provider, MD  lidocaine (XYLOCAINE) 2 % jelly Apply 1 application topically 3 (three) times daily.    Historical Provider, MD  loperamide (IMODIUM) 2 MG capsule Take 2 mg by mouth as needed for diarrhea or loose stools.     Historical Provider, MD  LORazepam (ATIVAN) 0.5 MG tablet Take 0.5 mg by mouth 2 (two) times daily.     Historical Provider, MD  losartan (COZAAR) 100 MG tablet Take 100 mg by mouth daily.    Historical Provider, MD  mupirocin cream (BACTROBAN) 2 % Apply 1 application topically 2 (two) times daily.    Historical Provider, MD  nystatin (MYCOSTATIN) powder Apply topically every 6 (six) hours as needed (yeast).    Historical Provider, MD  omeprazole (PRILOSEC) 20 MG capsule Take 20 mg by mouth daily.    Historical Provider, MD  polyethylene glycol (MIRALAX / GLYCOLAX) packet Take 17 g by mouth daily as needed for mild constipation.    Historical Provider, MD  potassium chloride (K-DUR) 10 MEQ tablet Take 10 mEq by mouth daily.    Historical Provider, MD  pravastatin (PRAVACHOL) 40 MG tablet Take 40 mg by mouth daily.    Historical Provider, MD  QUEtiapine (SEROQUEL) 50 MG tablet Take 50 mg by mouth at bedtime.    Historical Provider, MD  shark liver oil-cocoa butter (QC HEMORRHOIDAL) 0.25-3-85.5 % suppository Place 1 suppository rectally as needed for hemorrhoids.    Historical Provider, MD  Skin Protectants, Misc. (BAZA PROTECT EX) Apply topically 2 (two) times daily.    Historical Provider, MD  sodium fluoride (PREVIDENT 5000 PLUS) 1.1 % CREA dental cream Place 1 application onto teeth every morning.    Historical Provider, MD   BP 132/76 mmHg  Pulse 70  Resp 18  SpO2 99% Physical Exam  Constitutional: She appears well-developed and well-nourished. No distress.  HENT:  Head: Normocephalic. Head is with contusion.    Mouth/Throat: Oropharynx is clear and moist.  Eyes: Conjunctivae and EOM are normal.  32mm pupils bilaterally  Neck: Neck supple.  In c-collar and difficult to discern if pt has neck pain  Cardiovascular: Normal rate, regular rhythm and intact distal pulses.   No murmur heard. Pulmonary/Chest: Effort normal and breath sounds normal. No respiratory distress. She has no  wheezes. She has no rales.  Abdominal: Soft. She exhibits no distension. There is no tenderness. There is no rebound and no guarding.  Musculoskeletal: Normal range of motion. She exhibits no edema or tenderness.  Neurological: She is alert.  Moves all extremities without difficulty  Skin: Skin is warm and dry. No rash noted. No erythema.  Psychiatric: She has a normal mood and affect. Her behavior is normal.  Nursing note and vitals reviewed.   ED Course  Procedures (including critical care time) Labs Review Labs Reviewed - No data to display  Imaging Review Ct Head Wo Contrast  03/13/2014   CLINICAL DATA:  Fall, possible seizure, Alzheimer's  dementia  EXAM: CT HEAD WITHOUT CONTRAST  CT CERVICAL SPINE WITHOUT CONTRAST  TECHNIQUE: Multidetector CT imaging of the head and cervical spine was performed following the standard protocol without intravenous contrast. Multiplanar CT image reconstructions of the cervical spine were also generated.  COMPARISON:  CT head dated 12/31/2013. CT head/cervical spine dated 08/18/2013.  FINDINGS: CT HEAD FINDINGS  No evidence of parenchymal hemorrhage or extra-axial fluid collection. No mass lesion, mass effect, or midline shift.  No CT evidence of acute infarction.  Subcortical white matter and periventricular small vessel ischemic changes.  Global cortical atrophy.  Secondary ventricular prominence.  The visualized paranasal sinuses are essentially clear. The mastoid air cells are unopacified.  Soft tissue swelling/extracranial hematoma overlying the right parietal bone.  No evidence of calvarial fracture.  CT CERVICAL SPINE FINDINGS  Normal cervical lordosis.  No evidence of fracture or dislocation. Vertebral body heights and intervertebral disc spaces are maintained. Dens appears intact.  No prevertebral soft tissue swelling.  Mild multilevel degenerative changes of the mid cervical spine.  Visualized thyroid is unremarkable.  Visualized lung apices are clear.   IMPRESSION: Soft tissue swelling cystic screening hematoma overlying the right parietal bone. No evidence of calvarial fracture.  No evidence of acute intracranial abnormality. Atrophy with small vessel ischemic changes.  No evidence of traumatic injury to the cervical spine. Mild degenerative changes.   Electronically Signed   By: Julian Hy M.D.   On: 03/13/2014 10:16   Ct Cervical Spine Wo Contrast  03/13/2014   CLINICAL DATA:  Fall, possible seizure, Alzheimer's dementia  EXAM: CT HEAD WITHOUT CONTRAST  CT CERVICAL SPINE WITHOUT CONTRAST  TECHNIQUE: Multidetector CT imaging of the head and cervical spine was performed following the standard protocol without intravenous contrast. Multiplanar CT image reconstructions of the cervical spine were also generated.  COMPARISON:  CT head dated 12/31/2013. CT head/cervical spine dated 08/18/2013.  FINDINGS: CT HEAD FINDINGS  No evidence of parenchymal hemorrhage or extra-axial fluid collection. No mass lesion, mass effect, or midline shift.  No CT evidence of acute infarction.  Subcortical white matter and periventricular small vessel ischemic changes.  Global cortical atrophy.  Secondary ventricular prominence.  The visualized paranasal sinuses are essentially clear. The mastoid air cells are unopacified.  Soft tissue swelling/extracranial hematoma overlying the right parietal bone.  No evidence of calvarial fracture.  CT CERVICAL SPINE FINDINGS  Normal cervical lordosis.  No evidence of fracture or dislocation. Vertebral body heights and intervertebral disc spaces are maintained. Dens appears intact.  No prevertebral soft tissue swelling.  Mild multilevel degenerative changes of the mid cervical spine.  Visualized thyroid is unremarkable.  Visualized lung apices are clear.  IMPRESSION: Soft tissue swelling cystic screening hematoma overlying the right parietal bone. No evidence of calvarial fracture.  No evidence of acute intracranial abnormality. Atrophy with  small vessel ischemic changes.  No evidence of traumatic injury to the cervical spine. Mild degenerative changes.   Electronically Signed   By: Julian Hy M.D.   On: 03/13/2014 10:16     EKG Interpretation None      MDM   Final diagnoses:  Fall  Scalp hematoma, initial encounter   patient with a history of dementia who currently has advanced directives stating she does not what any extreme measures including IV hydration treatment of infections. She presents with her son after a fall at the nursing home. Possible seizure after the fall however son says he was not present during the fall. Patient is at her  baseline per her son. Hematoma to the right parietal area of the head patient appears to be uncomfortable with moving. No focal tenderness in the lower or upper extremities. Patient due to her severe dementia cannot give any history. Son states it's okay to do a CT of her head and neck just no if there is something else going on that would require her to have more hospice needs. She currently does take Keppra and is not on any anticoagulation.  Head and neck CT pending. At this time son is refusing all blood work or urine because he says they will not treat anything even if it's abnormal.  10:45 AM Normal imaging.  Pt d/ced back to facility   Blanchie Dessert, MD 03/13/14 [place], MD 03/13/14 1047

## 2014-03-13 NOTE — ED Notes (Signed)
Per EMS pt from nursing home, pt has been up walking around since 3 am this morning and nurse observed patient fall and possible seizure activity. Pt was not seizing on EMS arrival. Pt has hx of dementia. Pt is at her baseline for orientation. Per Son pt has been seen here for seizures 2 times last year.

## 2014-05-15 DEATH — deceased

## 2015-03-21 ENCOUNTER — Encounter: Payer: Self-pay | Admitting: Internal Medicine

## 2015-09-07 IMAGING — CT CT CERVICAL SPINE W/O CM
3 of 5 series · 11 of 33 positions shown, 13 images · non-contrast
Comparison: CT head dated 12/31/2013. CT head/cervical spine dated
08/18/2013.

CLINICAL DATA: Fall, possible seizure, Alzheimer's dementia

EXAM:
CT HEAD WITHOUT CONTRAST
CT CERVICAL SPINE WITHOUT CONTRAST
TECHNIQUE: Multidetector CT imaging of the head and cervical spine was
performed following the standard protocol without intravenous
contrast. Multiplanar CT image reconstructions of the cervical spine
were also generated.

[Series 5: c_spine 2.0 i40s 3 · axial · 0.29mm/px · z∈[+1226,+1322]mm · 3 of 82 slices shown, 4 images]
[im 17/82  soft-tissue]
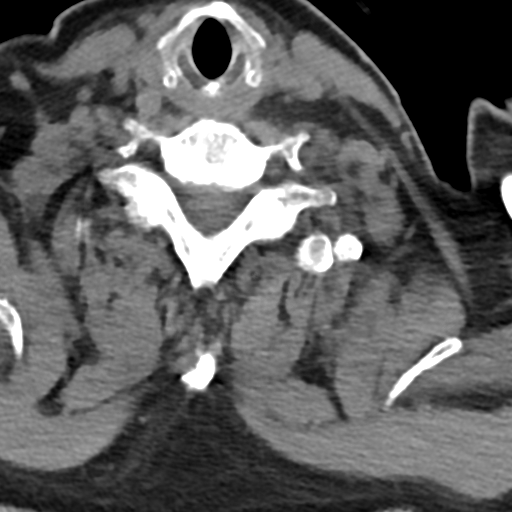
[im 17/82  bone]
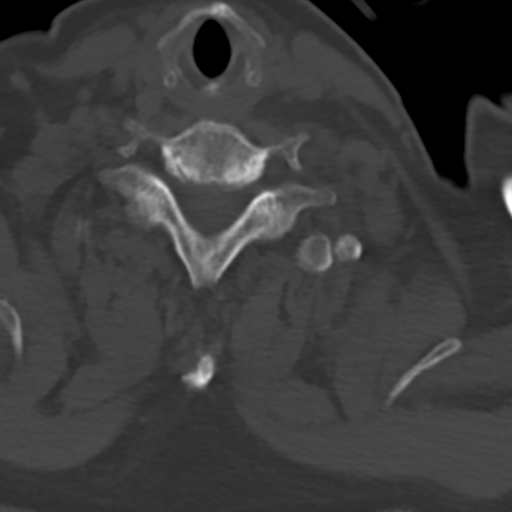
[im 49/82  bone]
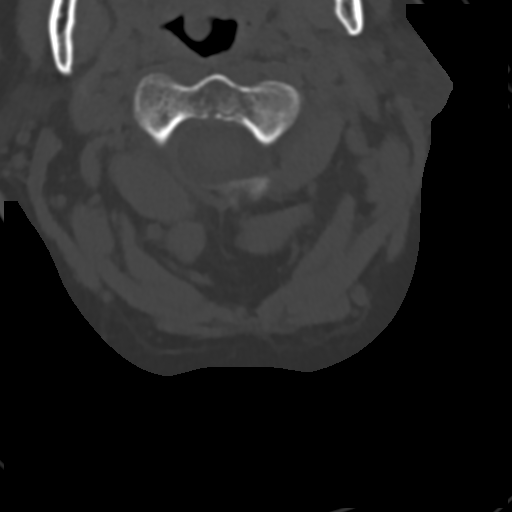
[im 65/82  bone]
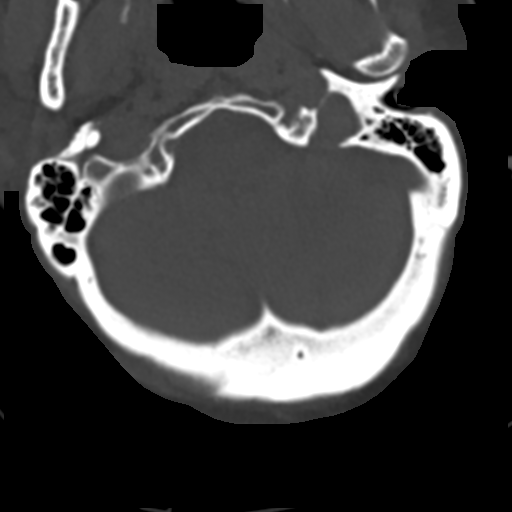

[Series 7: coronals · coronal · 0.16mm/px · 3 of 58 slices shown]
[im 12/58  bone]
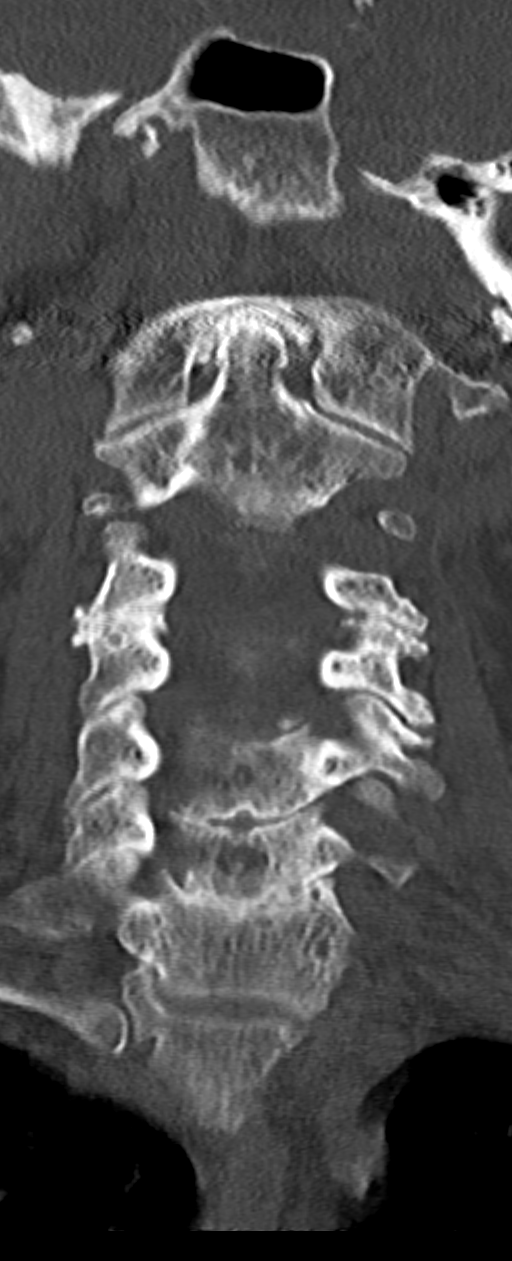
[im 23/58  bone]
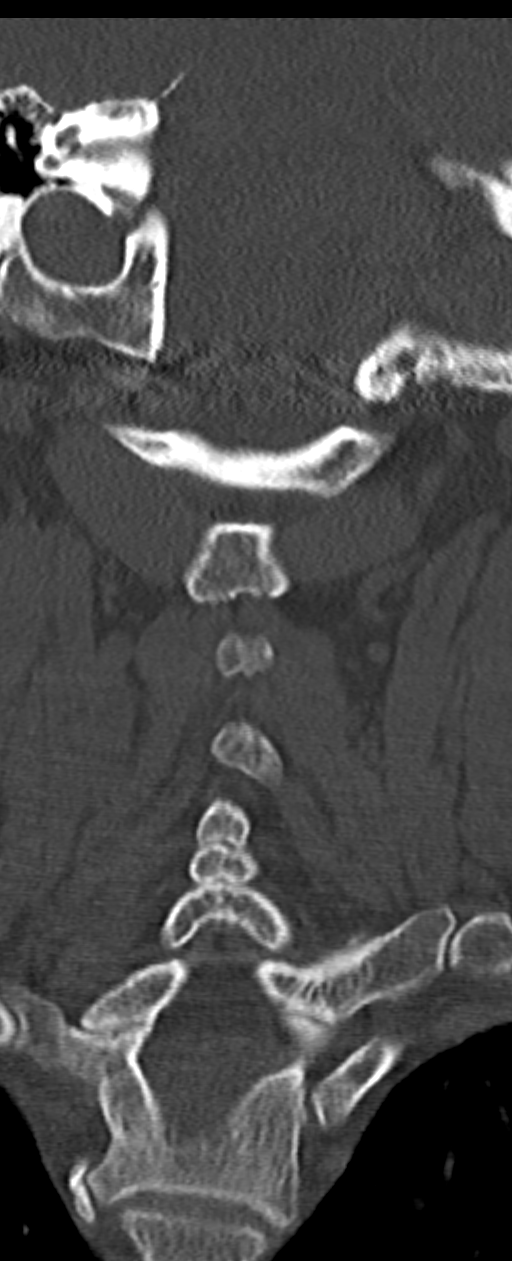
[im 35/58  bone]
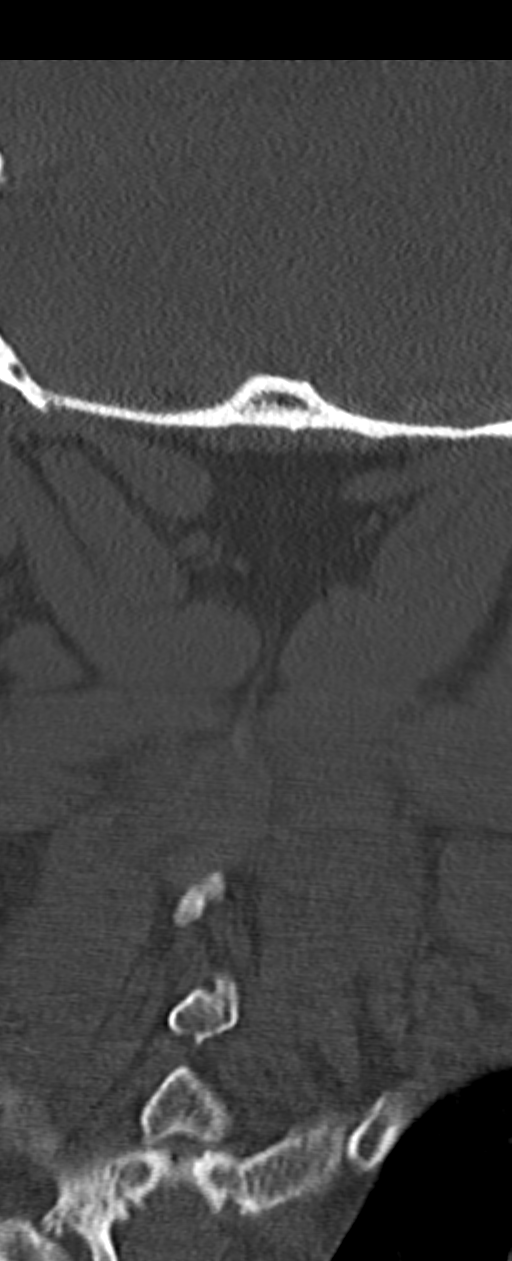

[Series 8: sagittals · sagittal · 0.26mm/px · 5 of 40 slices shown, 6 images]
[im 14/40  bone]
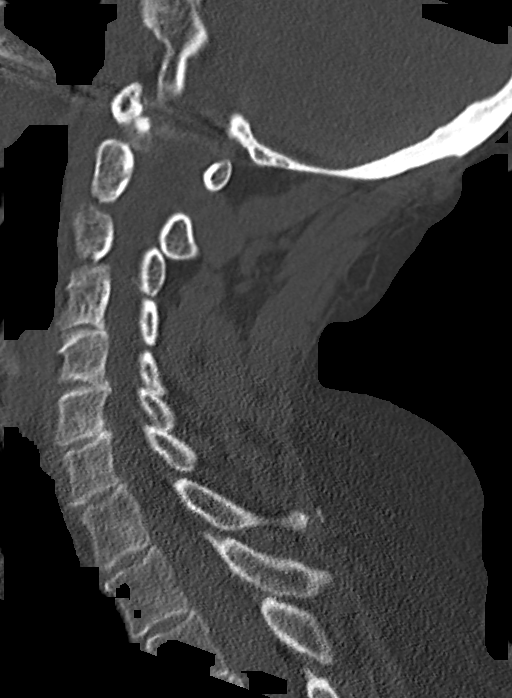
[im 17/40  bone]
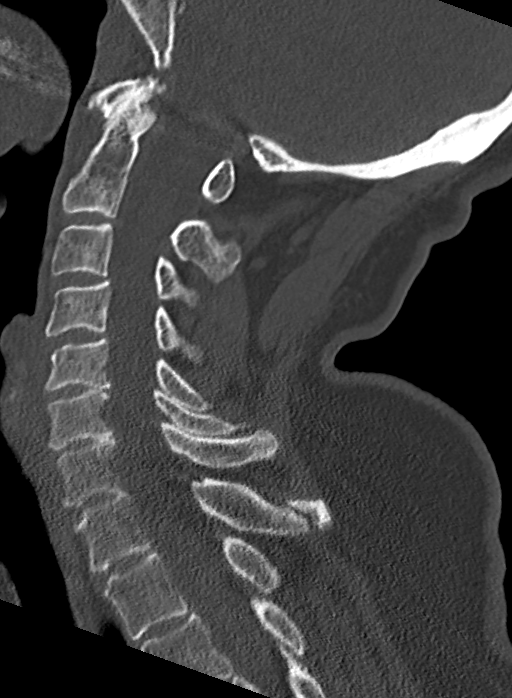
[im 20/40  soft-tissue]
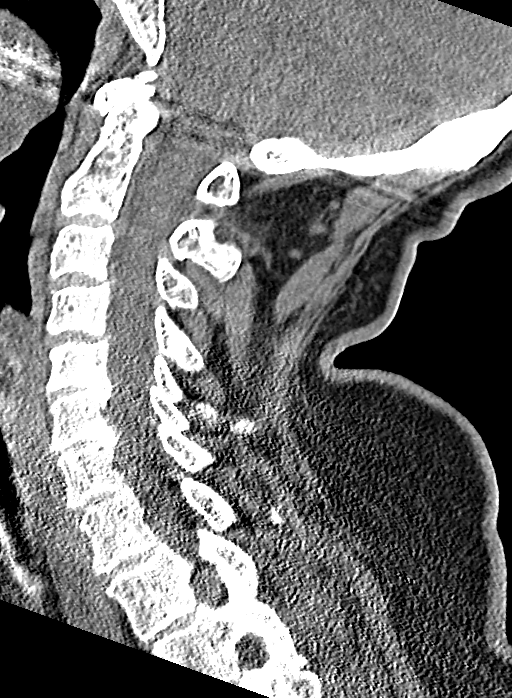
[im 20/40  bone]
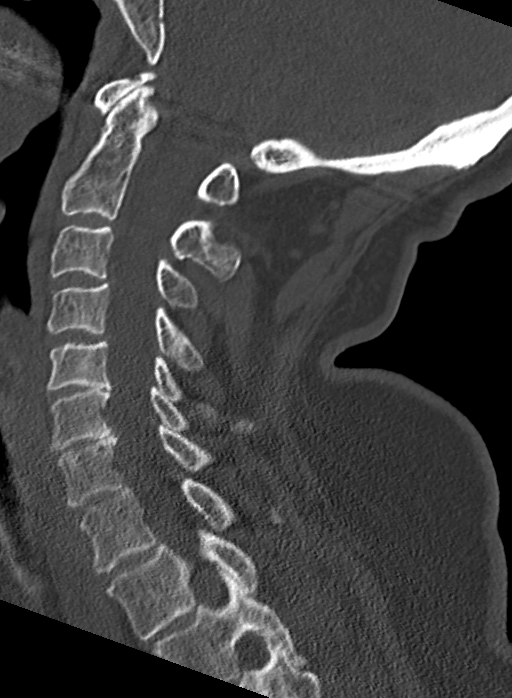
[im 23/40  bone]
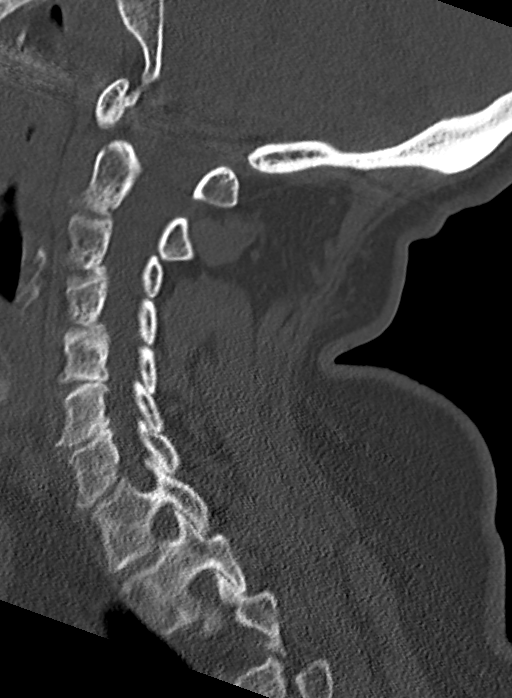
[im 27/40  bone]
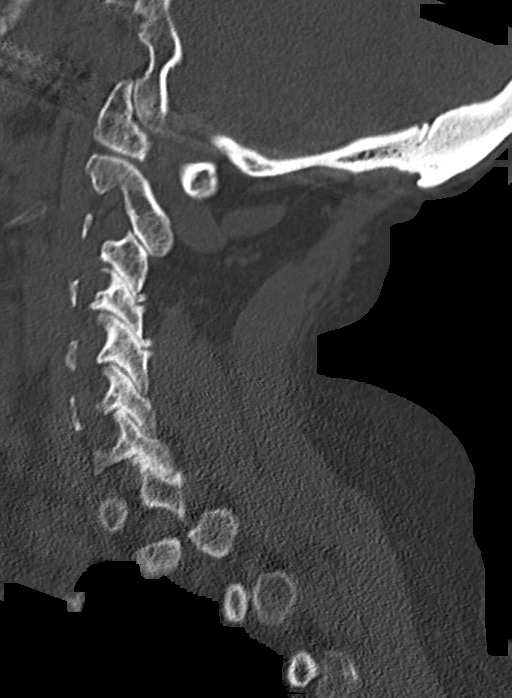

[11 of 33 positions shown; findings below may reference images not displayed]

FINDINGS: CT HEAD FINDINGS

No evidence of parenchymal hemorrhage or extra-axial fluid
collection. No mass lesion, mass effect, or midline shift.

No CT evidence of acute infarction.

Subcortical white matter and periventricular small vessel ischemic
changes.

Global cortical atrophy.  Secondary ventricular prominence.

The visualized paranasal sinuses are essentially clear. The mastoid
air cells are unopacified.

Soft tissue swelling/extracranial hematoma overlying the right
parietal bone.

No evidence of calvarial fracture.

CT CERVICAL SPINE FINDINGS

Normal cervical lordosis.

No evidence of fracture or dislocation. Vertebral body heights and
intervertebral disc spaces are maintained. Dens appears intact.

No prevertebral soft tissue swelling.

Mild multilevel degenerative changes of the mid cervical spine.

Visualized thyroid is unremarkable.

Visualized lung apices are clear.
IMPRESSION: Soft tissue swelling cystic screening hematoma overlying the right
parietal bone. No evidence of calvarial fracture.

No evidence of acute intracranial abnormality. Atrophy with small
vessel ischemic changes.

No evidence of traumatic injury to the cervical spine. Mild
degenerative changes.
